# Patient Record
Sex: Female | Born: 1980 | Race: White | Hispanic: No | Marital: Married | Smoking: Never smoker
Health system: Southern US, Community
[De-identification: ages and names within clinical notes are randomized; demographics above are authoritative.]

## PROBLEM LIST (undated history)

## (undated) DIAGNOSIS — J45909 Unspecified asthma, uncomplicated: Secondary | ICD-10-CM

## (undated) DIAGNOSIS — E119 Type 2 diabetes mellitus without complications: Secondary | ICD-10-CM

## (undated) HISTORY — DX: Type 2 diabetes mellitus without complications: E11.9

## (undated) HISTORY — PX: APPENDECTOMY: SHX54

## (undated) HISTORY — DX: Unspecified asthma, uncomplicated: J45.909

## (undated) HISTORY — PX: CARPAL TUNNEL RELEASE: SHX101

## (undated) HISTORY — PX: AUGMENTATION MAMMAPLASTY: SUR837

---
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
## 2001-04-02 ENCOUNTER — Other Ambulatory Visit: Admission: RE | Admit: 2001-04-02 | Discharge: 2001-04-02 | Payer: Self-pay | Admitting: Family Medicine

## 2002-05-27 ENCOUNTER — Encounter: Admission: RE | Admit: 2002-05-27 | Discharge: 2002-08-25 | Payer: Self-pay | Admitting: Endocrinology

## 2005-12-09 ENCOUNTER — Ambulatory Visit (HOSPITAL_COMMUNITY): Admission: RE | Admit: 2005-12-09 | Discharge: 2005-12-09 | Payer: Self-pay | Admitting: Orthopedic Surgery

## 2007-11-11 ENCOUNTER — Encounter: Admission: RE | Admit: 2007-11-11 | Discharge: 2008-02-04 | Payer: Self-pay | Admitting: Orthopedic Surgery

## 2008-01-27 ENCOUNTER — Ambulatory Visit (HOSPITAL_COMMUNITY): Admission: RE | Admit: 2008-01-27 | Discharge: 2008-01-27 | Payer: Self-pay | Admitting: Orthopedic Surgery

## 2010-06-22 NOTE — Op Note (Signed)
[place]  Patient:    Gail Jones, Gail Jones                         MRN: [id number] Proc. Date: 05/22/99 Adm. Date:  [id number] Attending:  Armanda Magic                           Operative Report  INDICATIONS:  This is a 30 year old white female with a history of inappropriate sinus tachycardia and probable paroxysmal supraventricular tachycardia with borderline hypotension.  She has been having symptoms of palpitations and dizziness.  She now presents for tilt-table testing.  DESCRIPTION OF PROCEDURE:  The patient was brought to the electrophysiology laboratory in the fasted, nonsedated state.  Informed consent was obtained. The patient was connected to continuous heartrate and pulse oximetry monitoring and  intermittent blood pressure monitoring.  An IV was placed in her arm.  The patient was placed on the tilt-table and baseline blood pressure measured for a total of 9 minutes.  She was then tilted upright to 70 degrees for a total of 45 minutes. She had no symptoms during her upright tilt.  Her baseline blood pressure ranged from 108/72 to 117/62 with heartrates of 70 to 80.  During her upright tilt her lowest blood pressure was 91/70 with a heartrate anywhere from 78 to 119.  Again she had no symptoms during the tilt and at the end of the study, she was transferred back to her room in stable condition.  IMPRESSION:  Negative tilt-table test.  The patient appears to have appropriate  sinus tachycardia for mild decrease in blood pressure with upright tilt.  We could not reproduce her symptoms of palpitations, therefore, we will treat her for probable PSVT on her recent Holter monitor with Cardizem CD 120 mg a day.  She ill follow up in two weeks. DD:  05/22/99 TD:  05/22/99 Job: [id number]

## 2012-03-16 ENCOUNTER — Encounter: Payer: 59 | Attending: Endocrinology | Admitting: *Deleted

## 2012-03-16 ENCOUNTER — Encounter: Payer: Self-pay | Admitting: *Deleted

## 2012-03-16 DIAGNOSIS — Z713 Dietary counseling and surveillance: Secondary | ICD-10-CM | POA: Insufficient documentation

## 2012-03-16 DIAGNOSIS — E109 Type 1 diabetes mellitus without complications: Secondary | ICD-10-CM | POA: Insufficient documentation

## 2012-03-16 NOTE — Progress Notes (Signed)
  Medical Nutrition Therapy:  Appt start time: 1500 end time:  1600.  Assessment:  Primary concerns today: patient here for nutrition and preparation for pregnancy with DM1. On Tandem T-slim pump with Dexcom G4 CGM system. Lives with husband, they both shop and prepare meals. 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 She states she is attempting to follow the Paleo Diet as it is lower in Carbohydrate.  MEDICATIONS: see list. On Tandem T-slim insulin pump Stated current pump settings: Basal: MN: 0.725  4 AM: 0.925  8 AM: 1.05 for rest of day States she does use Temp Basal for anaerobic exercise by increasing rate by 80% due to climb in BG  Bolus: Carb ratio: 1/15 grams but she states she corrects that to 1/12 due to elevated post meal BGs. Correction: 1/40 Target: 100   DIETARY INTAKE:  Usual eating pattern includes 3 meals and 1-2 snacks per day.  Everyday foods include good variety of all food groups.  Avoided foods include high calorie foods.    24-hr recall:  B ( AM): eggs, occasionally bacon or grits, skim milk or water  Snk ( AM): sleeping  L (3 PM): grilled chicken and vegetables OR other meat and vegetables, diet soda Snk ( PM): not if going into work, on days off,  D (7 PM or 12 AM): meat and vegetables, gluten free bread occasionally, diet soda or water Snk ( PM): pkg of crackers or a banana Beverages: diet soda or water, skim milk  Usual physical activity: cross fit at gym 3 days a week  Estimated energy needs: 1600 calories 180 g carbohydrates 120 g protein 44 g fat  Progress Towards Goal(s):  In progress.   Nutritional Diagnosis:  NB-1.1 Food and nutrition-related knowledge deficit As related to diabetes maagement prior to pregnancy.  As evidenced by A1c of 6.9% improved from 8% earlier.    Intervention:  Nutrition counseling and diabetes education initiated. Discussed her use of pump  therapy, and encouraged her to continue using the bolus tool to allow consistency with BG management. Confirmed the importance SMBG and value of checking BG at alternate times of day in conjunction with her CGM and acknowledged the improvement in her A1c,. Reviewed her Carb Counting ability and discussed that if she has to eat to prevent low BG in the afternoons, perhaps her basal rate is too strong at that time of day.   Plan: Consider a stronger Carb Ratio if post meal BGs are going more than 40 mg/dl above pre-meal BG Consider using extended bolus for higher fat meals to help with post meal BGs. Continue with current activity plan! Continue with healthy food choices Use snacks for appetite control if needed Continue checking BG as directed by MD and use of CGM If you have to eat to prevent low BG, perhaps your Basal Rate is too strong at that time of day.  Handouts given during visit include:  Diabetes Log Sheet with demonstration of Extended Bolus option on the back   Monitoring/Evaluation:  Dietary intake, exercise, ongoing use of pump and CGM, and body weight prn.

## 2012-03-16 NOTE — Patient Instructions (Addendum)
Plan: Consider a stronger Carb Ratio if post meal BGs are going more than 40 mg/dl above pre-meal BG Consider using extended bolus for higher fat meals to help with post meal BGs. Continue with current activity plan! Continue with healthy food choices Use snacks for appetite control if needed Continue checking BG as directed by MD and use of CGM If you have to eat to prevent low BG, perhaps your Basal Rate is too strong at that time of day.

## 2012-11-05 ENCOUNTER — Ambulatory Visit (INDEPENDENT_AMBULATORY_CARE_PROVIDER_SITE_OTHER): Payer: Self-pay | Admitting: Family Medicine

## 2012-11-05 DIAGNOSIS — E119 Type 2 diabetes mellitus without complications: Secondary | ICD-10-CM

## 2012-11-05 NOTE — Progress Notes (Signed)
Subjective/Objective:  Patient presents at the [facility] for the employee sponsored Link to Wellness program for her three month follow-up appointment. She is a new patient for me today, as she has seen Armen Pickup, RN in the past. Patient is a Type 1 diabetic, diagnosed when she was 32 years old. Patient sees Dr. Adrian Prince for her endocrinology visits every three months. She is currently using the Tandem T-Slim insulin pump after taking a break for approximately one month in August 2014. Patient is very knowledgeable about her pump, carbohydrate counting, insulin dosing, correcting hyperglycemia and hypoglycemia, and testing her blood sugar. She also has a CGM that monitors her blood glucose. Patient is currently trying to get pregnant and will undergo another round of IVF within the next few months. She is highly motivated to keep her A1c under tight control while trying to conceive as well as during pregnancy.  A1c 6.8% (07/28/12); BP 132/87; P 77; Ht 5'1"; Wt 173 lbs; 7 day CBG average 145; 14 day CBG average 147; 30 day CBG average 174; Highest CBG 244; Lowest CBG 65  Assessment:  Patient is currently under fairly good control with her current medication regimen. She is currently using a Tandem T-Slim pump as well as Symlinpen for better after meal coverage. Patient uses Novolog insulin dosed 1 unit for 15gm carbohydrate. Her typical daily usage of Novolog is 36-45 units. She will correct with 1 unit of Novolog for blood glucose over 100. 1 unit typically brings her blood glucose down by about 40 points. Patient knows how to correctly count carbs and adjust insulin dose based on total carbohydrates per meal. Previous A1c of 6.8% was taken on 07/28/12 at Dr. Rinaldo Cloud office. Patient checks blood glucose 2 times a day, utilizing a CGM. Patient also uses a glucometer two times daily in order to calibrate the CGM. She reports one recent hypoglycemia episode when she felt "shaky." Patient's  blood glucose was 65 at that time. However, hypoglycemia frequency for the patient is extremely rare. In the last 10 years, patient reports that she has not had any low events where she could not manage herself. When starting back on her pump about a month ago, patient did experience some lows that she easily corrected with glucose tablets. Patient has hypoglycemia awareness, and the CGM alerts her when her blood glucose is dropping. She is scheduled to return to Dr. Evlyn Kanner on 11/02/12 for a repeat A1c. Patient was previously exercising 3-4 days/week. Admits that she has not been exercising regularly the past few months due to vacations. Patient checks feet weekly. Sees dentist every 6 months. Due for annual eye exam. Patient is not a candidate for ACE-inhibitor as she is currently trying to get pregnant.   Plan:  1. Increase exercise to achieve better A1c as well as decrease insulin requirement. 2. Make yearly eye exam. 3. Follow up with Paulino Door, PharmD at [facility] for 3 month follow-up on January 8th at 8:30am.

## 2013-02-11 ENCOUNTER — Ambulatory Visit (INDEPENDENT_AMBULATORY_CARE_PROVIDER_SITE_OTHER): Payer: Self-pay | Admitting: Family Medicine

## 2013-02-11 DIAGNOSIS — E119 Type 2 diabetes mellitus without complications: Secondary | ICD-10-CM

## 2013-02-11 NOTE — Progress Notes (Signed)
Subjective/Objective:  Patient presents today for Link to Wellness 3 month DM follow-up. Patient is a Type 1 diabetic, diagnosed when she was 33 years old. She is currently using the Tandem T-Slim insulin pump. Patient is very knowledgeable about her pump, carbohydrate counting, insulin dosing, correcting hyperglycemia and hypoglycemia, and testing her blood sugar. She also has a CGM that monitors her blood glucose. Patient reports no issues with her diabetes with the exception of one severe hypoglycemia episode that occurred approximately 3 weeks ago. Hr blood glucose dropped dangerously low, and EMS was called since her glucagon pen was expired. EMS was able to correct her blood glucose with dextrose, and patient was fine after that. Patient was not wearing her CGM since she her CGM was not working and she was waiting on a replacement. Patient also reports some recent depression that is currently being treated with medication.  A1c (02/08/13): 7.0% (up from 6.8% in June); BP 128/83; P 67; Ht 5'1"; Wt 172 pounds Patient has CGM but did not bring glucometer to today's appointment. Recalls BG normally between 120-155 with highest being 320.   Assessment:  Patient is currently using a Tandem T-Slim pump as well as Symlinpen for better after meal coverage. Patient uses Novolog insulin dosed 1 unit for 15gm carbohydrate. Her typical daily usage of Novolog is 36-45 units. She will correct with 1 unit of Novolog for blood glucose over 100. 1 unit typically brings her blood glucose down by about 40 points. Patient's last A1c was 7.0% on 02/08/13 (up from 6.8% in June). She is scheduled to return to Dr. Evlyn [facility] in February for her three month follow-up. Patient wears CMG but also checks blood glucose 2-3 times per day using a One Touch Verio IQ meter. Patient knows how to correctly count carbs and adjust insulin dose based on total carbohydrates per meal.   Patient reports one severe hypoglycemia episode that occurred  approximately 3 weeks ago. Patient had worked out previously that evening, and she forgot to eat anything before going to bed. Consequently, her blood glucose dropped dangerously low, and EMS was called since her glucagon pen was expired. EMS was able to correct her blood glucose with dextrose, and patient was fine after that. Patient was not wearing her CGM since she her CGM was not working and she was waiting on a replacement. Patient now has glucagon pen and will report any increase in hypoglycemic episodes.  Patient is currently exercising 3-4 days/week combining both cardio and weights. Sees dentist every 6 months. Had annual eye exam in late December with normal results. Checks feet every 1-2 days. Patient is not a candidate for ACE-inhibitor or daily aspirin as she is currently trying to get pregnant. Patient also reports some recent depression that is currently being treated with Cymbalta.   Plan:  1. Increase exercise to achieve lower A1c, less insulin requirement, and to help with depression.  2. Notify Dr. Evlyn [facility] and/or Morrie SheldonAshley if frequency of hypoglycemia increases.  3. Notify Dr. Evlyn [facility] if Cymbalta is not helping depression symptoms by 02/17/13.  4. Follow up with Paulino DoorAshley Iyah Laguna, PharmD at the Outpatient Pharmacy on Thursday, April 9th at 8:45am.

## 2013-05-20 ENCOUNTER — Ambulatory Visit (INDEPENDENT_AMBULATORY_CARE_PROVIDER_SITE_OTHER): Payer: Self-pay | Admitting: Family Medicine

## 2013-05-20 DIAGNOSIS — E119 Type 2 diabetes mellitus without complications: Secondary | ICD-10-CM

## 2013-05-20 NOTE — Progress Notes (Signed)
Subjective/Objective:  Patient presents today for her 3 month DM follow-up through the employer-sponsored Link to Wellness program. Patient is a Type 1 diabetic, diagnosed when she was 33 years old. She is currently using the Tandem T-Slim insulin pump. Patient is very knowledgeable about her pump, carbohydrate counting, insulin dosing, correcting hyperglycemia and hypoglycemia, and testing her blood sugar. She also has a CGM that monitors her blood glucose. Patient reports no issues with her diabetes as she is keeping a tighter control on her blood sugars for upcoming fertility treatments. She is scheduled to undergo In Vitro next week with Dr. Elesa Hackereaton as long as her A1c is under 7.0%. She reports doing well over the past three months since her last appointment with me. She has started exercising more and has actually tapered off of the Cymbalta with no recurrent depressive symptoms. She is currently using Novolog insulin for diabetes medication management.   Hemoglobin A1c: 6.8% in March (down from 7.0% in January); BP 117/64 mmHg; P 77 bpm; Ht 5'1"' Wt 172 lbs; BMI 32.5; Pain level 0/10; Patient reports fasting CBG between 80-150 (max 150)   Assessment:  Patient is currently using a Tandem T-Slim pump. She was using Symlinpen for better after-meal coverage but has recently stopped that medication as patient is currently undergoing fertility treatments with Dr. Elesa Hackereaton. Patient uses Novolog insulin dosed 1 unit for 15gm carbohydrate. Her typical daily usage of Novolog is 36-45 units. She will correct with 1 unit of Novolog for blood glucose over 100. 1 unit typically brings her blood glucose down by about 40 points. Patient's last A1c was 6.8% in March (down from 7.0% in January). She is scheduled to return to Dr. [facility]lyn [facility] later this month for her three month follow-up. She will also see Dr. Elesa Hackereaton next week for In Vitro treatment as long as her A1c is under 7.0%. Patient has been highly motivated to keep  blood sugars under control as the fertility treatments cannot be performed if her A1c is above 7.0%. Patient wears CMG but also checks blood glucose 2-3 times per day using a One Touch Verio IQ meter. Patient knows how to correctly count carbs and adjust insulin dose based on total carbs per meal. Patient reports a few hypoglycemia episodes since our last visit with a blood sugar of about 50. Patient will notify me and Dr. Evlyn [facility] if patient begins experiencing more hypoglycemic episodes.   Patient is currently exercising 3-4 days/week combining both cardio and weights. Sees dentist every 6 months. Had annual eye exam in late December with normal results. Checks feet every 1-2 days. Patient is not a candidate for ACE-inhibitor as she is currently trying to get pregnant. Patient has discontinued Cymbalta as she is trying to get pregnant. Reports no depressive symptoms at this point in time.   Plan:  Patient's personal goals before next follow up visit with me are as follows:  1. Continue maintaining regular exercise and healthy eating habits.  2. Monitor hypoglycemia episodes and report any increase in number of episodes.

## 2013-05-21 ENCOUNTER — Ambulatory Visit: Payer: 59 | Admitting: Family Medicine

## 2013-10-12 ENCOUNTER — Encounter: Payer: Self-pay | Admitting: Family Medicine

## 2013-10-12 DIAGNOSIS — E119 Type 2 diabetes mellitus without complications: Secondary | ICD-10-CM | POA: Insufficient documentation

## 2013-10-12 DIAGNOSIS — E109 Type 1 diabetes mellitus without complications: Secondary | ICD-10-CM | POA: Insufficient documentation

## 2013-10-12 NOTE — Progress Notes (Signed)
Patient ID: Gail Jones, female   DOB: 04/19/1980, 33 y.o.   MRN: [id number] Reviewed: Agree with the documentation and management of our [place] Pharmacologist.   

## 2013-10-12 NOTE — Progress Notes (Signed)
Patient ID: Gail Jones, female   DOB: January 24, 1981, 33 y.o.   MRN: [id number] Reviewed: Agree with the documentation and management of our [facility] Pharmacologist.

## 2013-10-12 NOTE — Progress Notes (Signed)
Patient ID: Gail Jones, female   DOB: 03/06/1980, 33 y.o.   MRN: [id number] Reviewed: Agree with the documentation and management of our [place] Pharmacologist.   

## 2015-09-11 DIAGNOSIS — F900 Attention-deficit hyperactivity disorder, predominantly inattentive type: Secondary | ICD-10-CM | POA: Diagnosis not present

## 2015-09-11 DIAGNOSIS — E109 Type 1 diabetes mellitus without complications: Secondary | ICD-10-CM | POA: Diagnosis not present

## 2015-09-11 DIAGNOSIS — R946 Abnormal results of thyroid function studies: Secondary | ICD-10-CM | POA: Diagnosis not present

## 2015-09-11 DIAGNOSIS — F329 Major depressive disorder, single episode, unspecified: Secondary | ICD-10-CM | POA: Diagnosis not present

## 2015-12-15 DIAGNOSIS — Z23 Encounter for immunization: Secondary | ICD-10-CM | POA: Diagnosis not present

## 2016-03-27 DIAGNOSIS — J22 Unspecified acute lower respiratory infection: Secondary | ICD-10-CM | POA: Diagnosis not present

## 2016-03-27 DIAGNOSIS — J209 Acute bronchitis, unspecified: Secondary | ICD-10-CM | POA: Diagnosis not present

## 2016-03-27 DIAGNOSIS — J111 Influenza due to unidentified influenza virus with other respiratory manifestations: Secondary | ICD-10-CM | POA: Diagnosis not present

## 2016-03-27 DIAGNOSIS — E108 Type 1 diabetes mellitus with unspecified complications: Secondary | ICD-10-CM | POA: Diagnosis not present

## 2016-04-05 DIAGNOSIS — R946 Abnormal results of thyroid function studies: Secondary | ICD-10-CM | POA: Diagnosis not present

## 2016-04-05 DIAGNOSIS — Z1389 Encounter for screening for other disorder: Secondary | ICD-10-CM | POA: Diagnosis not present

## 2016-04-05 DIAGNOSIS — E109 Type 1 diabetes mellitus without complications: Secondary | ICD-10-CM | POA: Diagnosis not present

## 2016-04-05 DIAGNOSIS — Z6834 Body mass index (BMI) 34.0-34.9, adult: Secondary | ICD-10-CM | POA: Diagnosis not present

## 2016-04-22 DIAGNOSIS — E109 Type 1 diabetes mellitus without complications: Secondary | ICD-10-CM | POA: Diagnosis not present

## 2016-05-08 DIAGNOSIS — J029 Acute pharyngitis, unspecified: Secondary | ICD-10-CM | POA: Diagnosis not present

## 2016-05-08 DIAGNOSIS — J020 Streptococcal pharyngitis: Secondary | ICD-10-CM | POA: Diagnosis not present

## 2016-05-16 DIAGNOSIS — J020 Streptococcal pharyngitis: Secondary | ICD-10-CM | POA: Diagnosis not present

## 2016-05-16 DIAGNOSIS — J309 Allergic rhinitis, unspecified: Secondary | ICD-10-CM | POA: Diagnosis not present

## 2016-07-02 DIAGNOSIS — M5416 Radiculopathy, lumbar region: Secondary | ICD-10-CM | POA: Diagnosis not present

## 2016-07-02 DIAGNOSIS — M9903 Segmental and somatic dysfunction of lumbar region: Secondary | ICD-10-CM | POA: Diagnosis not present

## 2016-07-02 DIAGNOSIS — M545 Low back pain: Secondary | ICD-10-CM | POA: Diagnosis not present

## 2016-07-02 DIAGNOSIS — S39012A Strain of muscle, fascia and tendon of lower back, initial encounter: Secondary | ICD-10-CM | POA: Diagnosis not present

## 2016-07-03 DIAGNOSIS — S39012A Strain of muscle, fascia and tendon of lower back, initial encounter: Secondary | ICD-10-CM | POA: Diagnosis not present

## 2016-07-03 DIAGNOSIS — M5416 Radiculopathy, lumbar region: Secondary | ICD-10-CM | POA: Diagnosis not present

## 2016-07-03 DIAGNOSIS — M9903 Segmental and somatic dysfunction of lumbar region: Secondary | ICD-10-CM | POA: Diagnosis not present

## 2016-07-03 DIAGNOSIS — M545 Low back pain: Secondary | ICD-10-CM | POA: Diagnosis not present

## 2016-07-04 DIAGNOSIS — M5416 Radiculopathy, lumbar region: Secondary | ICD-10-CM | POA: Diagnosis not present

## 2016-07-04 DIAGNOSIS — M545 Low back pain: Secondary | ICD-10-CM | POA: Diagnosis not present

## 2016-07-04 DIAGNOSIS — S39012A Strain of muscle, fascia and tendon of lower back, initial encounter: Secondary | ICD-10-CM | POA: Diagnosis not present

## 2016-07-04 DIAGNOSIS — M9903 Segmental and somatic dysfunction of lumbar region: Secondary | ICD-10-CM | POA: Diagnosis not present

## 2016-07-08 DIAGNOSIS — M9903 Segmental and somatic dysfunction of lumbar region: Secondary | ICD-10-CM | POA: Diagnosis not present

## 2016-07-08 DIAGNOSIS — S39012A Strain of muscle, fascia and tendon of lower back, initial encounter: Secondary | ICD-10-CM | POA: Diagnosis not present

## 2016-07-08 DIAGNOSIS — M545 Low back pain: Secondary | ICD-10-CM | POA: Diagnosis not present

## 2016-07-08 DIAGNOSIS — M5416 Radiculopathy, lumbar region: Secondary | ICD-10-CM | POA: Diagnosis not present

## 2016-07-11 DIAGNOSIS — M9903 Segmental and somatic dysfunction of lumbar region: Secondary | ICD-10-CM | POA: Diagnosis not present

## 2016-07-11 DIAGNOSIS — M545 Low back pain: Secondary | ICD-10-CM | POA: Diagnosis not present

## 2016-07-11 DIAGNOSIS — M5416 Radiculopathy, lumbar region: Secondary | ICD-10-CM | POA: Diagnosis not present

## 2016-07-11 DIAGNOSIS — S39012A Strain of muscle, fascia and tendon of lower back, initial encounter: Secondary | ICD-10-CM | POA: Diagnosis not present

## 2016-09-27 DIAGNOSIS — N898 Other specified noninflammatory disorders of vagina: Secondary | ICD-10-CM | POA: Diagnosis not present

## 2016-09-27 DIAGNOSIS — Z01419 Encounter for gynecological examination (general) (routine) without abnormal findings: Secondary | ICD-10-CM | POA: Diagnosis not present

## 2016-09-27 DIAGNOSIS — N760 Acute vaginitis: Secondary | ICD-10-CM | POA: Diagnosis not present

## 2016-09-27 DIAGNOSIS — B9689 Other specified bacterial agents as the cause of diseases classified elsewhere: Secondary | ICD-10-CM | POA: Diagnosis not present

## 2016-10-10 DIAGNOSIS — R946 Abnormal results of thyroid function studies: Secondary | ICD-10-CM | POA: Diagnosis not present

## 2016-10-10 DIAGNOSIS — E668 Other obesity: Secondary | ICD-10-CM | POA: Diagnosis not present

## 2016-10-10 DIAGNOSIS — Z1389 Encounter for screening for other disorder: Secondary | ICD-10-CM | POA: Diagnosis not present

## 2016-10-10 DIAGNOSIS — F3289 Other specified depressive episodes: Secondary | ICD-10-CM | POA: Diagnosis not present

## 2016-10-10 DIAGNOSIS — E109 Type 1 diabetes mellitus without complications: Secondary | ICD-10-CM | POA: Diagnosis not present

## 2016-11-06 DIAGNOSIS — E109 Type 1 diabetes mellitus without complications: Secondary | ICD-10-CM | POA: Diagnosis not present

## 2017-01-16 DIAGNOSIS — J0100 Acute maxillary sinusitis, unspecified: Secondary | ICD-10-CM | POA: Diagnosis not present

## 2017-01-16 DIAGNOSIS — J209 Acute bronchitis, unspecified: Secondary | ICD-10-CM | POA: Diagnosis not present

## 2017-02-03 DIAGNOSIS — E109 Type 1 diabetes mellitus without complications: Secondary | ICD-10-CM | POA: Diagnosis not present

## 2017-04-23 DIAGNOSIS — F900 Attention-deficit hyperactivity disorder, predominantly inattentive type: Secondary | ICD-10-CM | POA: Diagnosis not present

## 2017-04-23 DIAGNOSIS — E1065 Type 1 diabetes mellitus with hyperglycemia: Secondary | ICD-10-CM | POA: Diagnosis not present

## 2017-04-23 DIAGNOSIS — Z1389 Encounter for screening for other disorder: Secondary | ICD-10-CM | POA: Diagnosis not present

## 2017-04-23 DIAGNOSIS — R946 Abnormal results of thyroid function studies: Secondary | ICD-10-CM | POA: Diagnosis not present

## 2017-04-23 DIAGNOSIS — E668 Other obesity: Secondary | ICD-10-CM | POA: Diagnosis not present

## 2017-07-01 DIAGNOSIS — M25552 Pain in left hip: Secondary | ICD-10-CM | POA: Diagnosis not present

## 2017-07-08 DIAGNOSIS — M25552 Pain in left hip: Secondary | ICD-10-CM | POA: Diagnosis not present

## 2017-07-14 DIAGNOSIS — M25552 Pain in left hip: Secondary | ICD-10-CM | POA: Diagnosis not present

## 2017-07-28 DIAGNOSIS — M25552 Pain in left hip: Secondary | ICD-10-CM | POA: Diagnosis not present

## 2017-08-12 DIAGNOSIS — M25552 Pain in left hip: Secondary | ICD-10-CM | POA: Diagnosis not present

## 2017-08-25 DIAGNOSIS — Z794 Long term (current) use of insulin: Secondary | ICD-10-CM | POA: Diagnosis not present

## 2017-08-25 DIAGNOSIS — M25551 Pain in right hip: Secondary | ICD-10-CM | POA: Diagnosis not present

## 2017-08-25 DIAGNOSIS — E1065 Type 1 diabetes mellitus with hyperglycemia: Secondary | ICD-10-CM | POA: Diagnosis not present

## 2017-08-25 DIAGNOSIS — R946 Abnormal results of thyroid function studies: Secondary | ICD-10-CM | POA: Diagnosis not present

## 2017-09-10 DIAGNOSIS — S90562A Insect bite (nonvenomous), left ankle, initial encounter: Secondary | ICD-10-CM | POA: Diagnosis not present

## 2017-09-10 DIAGNOSIS — W57XXXA Bitten or stung by nonvenomous insect and other nonvenomous arthropods, initial encounter: Secondary | ICD-10-CM | POA: Diagnosis not present

## 2017-09-10 DIAGNOSIS — L089 Local infection of the skin and subcutaneous tissue, unspecified: Secondary | ICD-10-CM | POA: Diagnosis not present

## 2017-09-18 DIAGNOSIS — E109 Type 1 diabetes mellitus without complications: Secondary | ICD-10-CM | POA: Diagnosis not present

## 2017-09-24 DIAGNOSIS — M25552 Pain in left hip: Secondary | ICD-10-CM | POA: Diagnosis not present

## 2017-09-25 DIAGNOSIS — M7062 Trochanteric bursitis, left hip: Secondary | ICD-10-CM | POA: Diagnosis not present

## 2017-12-23 DIAGNOSIS — E109 Type 1 diabetes mellitus without complications: Secondary | ICD-10-CM | POA: Diagnosis not present

## 2017-12-23 DIAGNOSIS — Z794 Long term (current) use of insulin: Secondary | ICD-10-CM | POA: Diagnosis not present

## 2017-12-23 DIAGNOSIS — E1065 Type 1 diabetes mellitus with hyperglycemia: Secondary | ICD-10-CM | POA: Diagnosis not present

## 2017-12-23 DIAGNOSIS — M25551 Pain in right hip: Secondary | ICD-10-CM | POA: Diagnosis not present

## 2017-12-24 DIAGNOSIS — E109 Type 1 diabetes mellitus without complications: Secondary | ICD-10-CM | POA: Diagnosis not present

## 2018-02-20 DIAGNOSIS — Z794 Long term (current) use of insulin: Secondary | ICD-10-CM | POA: Diagnosis not present

## 2018-02-20 DIAGNOSIS — E109 Type 1 diabetes mellitus without complications: Secondary | ICD-10-CM | POA: Diagnosis not present

## 2018-03-10 DIAGNOSIS — N920 Excessive and frequent menstruation with regular cycle: Secondary | ICD-10-CM | POA: Diagnosis not present

## 2018-03-10 DIAGNOSIS — N854 Malposition of uterus: Secondary | ICD-10-CM | POA: Diagnosis not present

## 2018-03-10 DIAGNOSIS — N83292 Other ovarian cyst, left side: Secondary | ICD-10-CM | POA: Diagnosis not present

## 2018-04-01 ENCOUNTER — Other Ambulatory Visit: Payer: Self-pay | Admitting: Obstetrics and Gynecology

## 2018-04-01 DIAGNOSIS — Z1231 Encounter for screening mammogram for malignant neoplasm of breast: Secondary | ICD-10-CM

## 2018-04-10 DIAGNOSIS — J101 Influenza due to other identified influenza virus with other respiratory manifestations: Secondary | ICD-10-CM | POA: Diagnosis not present

## 2018-04-10 DIAGNOSIS — Z20828 Contact with and (suspected) exposure to other viral communicable diseases: Secondary | ICD-10-CM | POA: Diagnosis not present

## 2018-04-14 DIAGNOSIS — T3695XA Adverse effect of unspecified systemic antibiotic, initial encounter: Secondary | ICD-10-CM | POA: Diagnosis not present

## 2018-04-14 DIAGNOSIS — R05 Cough: Secondary | ICD-10-CM | POA: Diagnosis not present

## 2018-04-14 DIAGNOSIS — J22 Unspecified acute lower respiratory infection: Secondary | ICD-10-CM | POA: Diagnosis not present

## 2018-04-14 DIAGNOSIS — B379 Candidiasis, unspecified: Secondary | ICD-10-CM | POA: Diagnosis not present

## 2018-04-22 ENCOUNTER — Other Ambulatory Visit: Payer: Self-pay

## 2018-04-22 DIAGNOSIS — R6889 Other general symptoms and signs: Secondary | ICD-10-CM

## 2018-04-27 ENCOUNTER — Ambulatory Visit: Payer: 59

## 2018-04-28 LAB — NOVEL CORONAVIRUS, NAA: SARS-COV-2, NAA: NOT DETECTED

## 2018-04-30 DIAGNOSIS — E109 Type 1 diabetes mellitus without complications: Secondary | ICD-10-CM | POA: Diagnosis not present

## 2018-05-04 DIAGNOSIS — Z794 Long term (current) use of insulin: Secondary | ICD-10-CM | POA: Diagnosis not present

## 2018-05-04 DIAGNOSIS — E669 Obesity, unspecified: Secondary | ICD-10-CM | POA: Diagnosis not present

## 2018-05-04 DIAGNOSIS — R946 Abnormal results of thyroid function studies: Secondary | ICD-10-CM | POA: Diagnosis not present

## 2018-05-04 DIAGNOSIS — E109 Type 1 diabetes mellitus without complications: Secondary | ICD-10-CM | POA: Diagnosis not present

## 2018-06-16 DIAGNOSIS — E109 Type 1 diabetes mellitus without complications: Secondary | ICD-10-CM | POA: Diagnosis not present

## 2018-07-10 DIAGNOSIS — Z794 Long term (current) use of insulin: Secondary | ICD-10-CM | POA: Diagnosis not present

## 2018-07-10 DIAGNOSIS — E1065 Type 1 diabetes mellitus with hyperglycemia: Secondary | ICD-10-CM | POA: Diagnosis not present

## 2018-07-10 DIAGNOSIS — R609 Edema, unspecified: Secondary | ICD-10-CM | POA: Diagnosis not present

## 2018-08-31 DIAGNOSIS — E109 Type 1 diabetes mellitus without complications: Secondary | ICD-10-CM | POA: Diagnosis not present

## 2018-09-01 DIAGNOSIS — Z79899 Other long term (current) drug therapy: Secondary | ICD-10-CM | POA: Diagnosis not present

## 2018-09-01 DIAGNOSIS — R609 Edema, unspecified: Secondary | ICD-10-CM | POA: Diagnosis not present

## 2018-09-01 DIAGNOSIS — Z794 Long term (current) use of insulin: Secondary | ICD-10-CM | POA: Diagnosis not present

## 2018-09-01 DIAGNOSIS — E108 Type 1 diabetes mellitus with unspecified complications: Secondary | ICD-10-CM | POA: Diagnosis not present

## 2018-09-01 DIAGNOSIS — Z1331 Encounter for screening for depression: Secondary | ICD-10-CM | POA: Diagnosis not present

## 2018-09-01 DIAGNOSIS — E109 Type 1 diabetes mellitus without complications: Secondary | ICD-10-CM | POA: Diagnosis not present

## 2018-09-01 DIAGNOSIS — E669 Obesity, unspecified: Secondary | ICD-10-CM | POA: Diagnosis not present

## 2018-09-01 DIAGNOSIS — F909 Attention-deficit hyperactivity disorder, unspecified type: Secondary | ICD-10-CM | POA: Diagnosis not present

## 2018-09-07 DIAGNOSIS — E109 Type 1 diabetes mellitus without complications: Secondary | ICD-10-CM | POA: Diagnosis not present

## 2018-09-07 DIAGNOSIS — Z794 Long term (current) use of insulin: Secondary | ICD-10-CM | POA: Diagnosis not present

## 2018-09-24 DIAGNOSIS — E109 Type 1 diabetes mellitus without complications: Secondary | ICD-10-CM | POA: Diagnosis not present

## 2018-11-25 DIAGNOSIS — E109 Type 1 diabetes mellitus without complications: Secondary | ICD-10-CM | POA: Diagnosis not present

## 2018-11-26 DIAGNOSIS — F909 Attention-deficit hyperactivity disorder, unspecified type: Secondary | ICD-10-CM | POA: Diagnosis not present

## 2018-12-28 DIAGNOSIS — E109 Type 1 diabetes mellitus without complications: Secondary | ICD-10-CM | POA: Diagnosis not present

## 2019-01-12 DIAGNOSIS — M2550 Pain in unspecified joint: Secondary | ICD-10-CM | POA: Diagnosis not present

## 2019-01-12 DIAGNOSIS — M722 Plantar fascial fibromatosis: Secondary | ICD-10-CM | POA: Diagnosis not present

## 2019-01-13 DIAGNOSIS — M2550 Pain in unspecified joint: Secondary | ICD-10-CM | POA: Diagnosis not present

## 2019-01-18 DIAGNOSIS — E1065 Type 1 diabetes mellitus with hyperglycemia: Secondary | ICD-10-CM | POA: Diagnosis not present

## 2019-01-19 DIAGNOSIS — E669 Obesity, unspecified: Secondary | ICD-10-CM | POA: Diagnosis not present

## 2019-01-19 DIAGNOSIS — R946 Abnormal results of thyroid function studies: Secondary | ICD-10-CM | POA: Diagnosis not present

## 2019-01-19 DIAGNOSIS — Z794 Long term (current) use of insulin: Secondary | ICD-10-CM | POA: Diagnosis not present

## 2019-01-19 DIAGNOSIS — E109 Type 1 diabetes mellitus without complications: Secondary | ICD-10-CM | POA: Diagnosis not present

## 2019-02-03 DIAGNOSIS — Z794 Long term (current) use of insulin: Secondary | ICD-10-CM | POA: Diagnosis not present

## 2019-02-03 DIAGNOSIS — E109 Type 1 diabetes mellitus without complications: Secondary | ICD-10-CM | POA: Diagnosis not present

## 2019-03-03 DIAGNOSIS — F909 Attention-deficit hyperactivity disorder, unspecified type: Secondary | ICD-10-CM | POA: Diagnosis not present

## 2019-03-03 DIAGNOSIS — N6012 Diffuse cystic mastopathy of left breast: Secondary | ICD-10-CM | POA: Diagnosis not present

## 2019-03-03 DIAGNOSIS — E108 Type 1 diabetes mellitus with unspecified complications: Secondary | ICD-10-CM | POA: Diagnosis not present

## 2019-03-03 DIAGNOSIS — Z9882 Breast implant status: Secondary | ICD-10-CM | POA: Diagnosis not present

## 2019-03-03 DIAGNOSIS — Z0000 Encounter for general adult medical examination without abnormal findings: Secondary | ICD-10-CM | POA: Diagnosis not present

## 2019-03-09 DIAGNOSIS — M722 Plantar fascial fibromatosis: Secondary | ICD-10-CM | POA: Diagnosis not present

## 2019-03-10 DIAGNOSIS — Z0000 Encounter for general adult medical examination without abnormal findings: Secondary | ICD-10-CM | POA: Diagnosis not present

## 2019-03-13 DIAGNOSIS — R030 Elevated blood-pressure reading, without diagnosis of hypertension: Secondary | ICD-10-CM | POA: Diagnosis not present

## 2019-03-13 DIAGNOSIS — R519 Headache, unspecified: Secondary | ICD-10-CM | POA: Diagnosis not present

## 2019-03-15 DIAGNOSIS — R030 Elevated blood-pressure reading, without diagnosis of hypertension: Secondary | ICD-10-CM | POA: Diagnosis not present

## 2019-03-15 DIAGNOSIS — R002 Palpitations: Secondary | ICD-10-CM | POA: Diagnosis not present

## 2019-03-15 DIAGNOSIS — R9431 Abnormal electrocardiogram [ECG] [EKG]: Secondary | ICD-10-CM | POA: Diagnosis not present

## 2019-03-15 DIAGNOSIS — Z8249 Family history of ischemic heart disease and other diseases of the circulatory system: Secondary | ICD-10-CM | POA: Diagnosis not present

## 2019-03-18 DIAGNOSIS — Z9882 Breast implant status: Secondary | ICD-10-CM | POA: Diagnosis not present

## 2019-03-18 DIAGNOSIS — N6012 Diffuse cystic mastopathy of left breast: Secondary | ICD-10-CM | POA: Diagnosis not present

## 2019-03-18 DIAGNOSIS — N6320 Unspecified lump in the left breast, unspecified quadrant: Secondary | ICD-10-CM | POA: Diagnosis not present

## 2019-03-18 DIAGNOSIS — N6489 Other specified disorders of breast: Secondary | ICD-10-CM | POA: Diagnosis not present

## 2019-03-18 DIAGNOSIS — R928 Other abnormal and inconclusive findings on diagnostic imaging of breast: Secondary | ICD-10-CM | POA: Diagnosis not present

## 2019-04-13 DIAGNOSIS — I517 Cardiomegaly: Secondary | ICD-10-CM | POA: Diagnosis not present

## 2019-04-15 DIAGNOSIS — R030 Elevated blood-pressure reading, without diagnosis of hypertension: Secondary | ICD-10-CM | POA: Diagnosis not present

## 2019-04-15 DIAGNOSIS — Z8249 Family history of ischemic heart disease and other diseases of the circulatory system: Secondary | ICD-10-CM | POA: Diagnosis not present

## 2019-04-15 DIAGNOSIS — R002 Palpitations: Secondary | ICD-10-CM | POA: Diagnosis not present

## 2019-04-26 DIAGNOSIS — E109 Type 1 diabetes mellitus without complications: Secondary | ICD-10-CM | POA: Diagnosis not present

## 2019-05-17 DIAGNOSIS — Z803 Family history of malignant neoplasm of breast: Secondary | ICD-10-CM | POA: Diagnosis not present

## 2019-05-17 DIAGNOSIS — N6320 Unspecified lump in the left breast, unspecified quadrant: Secondary | ICD-10-CM | POA: Diagnosis not present

## 2019-05-20 ENCOUNTER — Other Ambulatory Visit: Payer: Self-pay | Admitting: General Surgery

## 2019-05-20 DIAGNOSIS — N6320 Unspecified lump in the left breast, unspecified quadrant: Secondary | ICD-10-CM

## 2019-05-27 DIAGNOSIS — Z1389 Encounter for screening for other disorder: Secondary | ICD-10-CM | POA: Diagnosis not present

## 2019-05-27 DIAGNOSIS — E109 Type 1 diabetes mellitus without complications: Secondary | ICD-10-CM | POA: Diagnosis not present

## 2019-05-27 DIAGNOSIS — I10 Essential (primary) hypertension: Secondary | ICD-10-CM | POA: Diagnosis not present

## 2019-05-27 DIAGNOSIS — F900 Attention-deficit hyperactivity disorder, predominantly inattentive type: Secondary | ICD-10-CM | POA: Diagnosis not present

## 2019-05-27 DIAGNOSIS — N6019 Diffuse cystic mastopathy of unspecified breast: Secondary | ICD-10-CM | POA: Diagnosis not present

## 2019-06-01 ENCOUNTER — Ambulatory Visit
Admission: RE | Admit: 2019-06-01 | Discharge: 2019-06-01 | Disposition: A | Discharge status: Home / Self Care | Payer: BC Managed Care – PPO | Source: Ambulatory Visit | Attending: General Surgery | Admitting: General Surgery

## 2019-06-01 ENCOUNTER — Other Ambulatory Visit: Payer: Self-pay

## 2019-06-01 DIAGNOSIS — N6320 Unspecified lump in the left breast, unspecified quadrant: Secondary | ICD-10-CM

## 2019-06-01 MED ORDER — GADOBUTROL 1 MMOL/ML IV SOLN
9.0000 mL | Freq: Once | INTRAVENOUS | Status: AC | PRN
  Administered 2019-06-01: 08:00:00 9 mL via INTRAVENOUS

## 2019-07-28 DIAGNOSIS — J209 Acute bronchitis, unspecified: Secondary | ICD-10-CM | POA: Diagnosis not present

## 2019-07-28 DIAGNOSIS — Z20822 Contact with and (suspected) exposure to covid-19: Secondary | ICD-10-CM | POA: Diagnosis not present

## 2019-07-28 DIAGNOSIS — R0981 Nasal congestion: Secondary | ICD-10-CM | POA: Diagnosis not present

## 2019-07-28 DIAGNOSIS — R05 Cough: Secondary | ICD-10-CM | POA: Diagnosis not present

## 2019-07-29 DIAGNOSIS — E109 Type 1 diabetes mellitus without complications: Secondary | ICD-10-CM | POA: Diagnosis not present

## 2019-07-29 DIAGNOSIS — Z794 Long term (current) use of insulin: Secondary | ICD-10-CM | POA: Diagnosis not present

## 2019-08-03 DIAGNOSIS — J019 Acute sinusitis, unspecified: Secondary | ICD-10-CM | POA: Diagnosis not present

## 2019-08-03 DIAGNOSIS — J209 Acute bronchitis, unspecified: Secondary | ICD-10-CM | POA: Diagnosis not present

## 2019-08-03 DIAGNOSIS — R05 Cough: Secondary | ICD-10-CM | POA: Diagnosis not present

## 2019-08-04 DIAGNOSIS — M722 Plantar fascial fibromatosis: Secondary | ICD-10-CM | POA: Diagnosis not present

## 2019-09-01 DIAGNOSIS — M722 Plantar fascial fibromatosis: Secondary | ICD-10-CM | POA: Diagnosis not present

## 2019-09-09 DIAGNOSIS — E1065 Type 1 diabetes mellitus with hyperglycemia: Secondary | ICD-10-CM | POA: Diagnosis not present

## 2019-09-09 DIAGNOSIS — Z794 Long term (current) use of insulin: Secondary | ICD-10-CM | POA: Diagnosis not present

## 2019-09-09 DIAGNOSIS — R946 Abnormal results of thyroid function studies: Secondary | ICD-10-CM | POA: Diagnosis not present

## 2019-09-09 DIAGNOSIS — E669 Obesity, unspecified: Secondary | ICD-10-CM | POA: Diagnosis not present

## 2019-09-09 DIAGNOSIS — I10 Essential (primary) hypertension: Secondary | ICD-10-CM | POA: Diagnosis not present

## 2019-09-17 DIAGNOSIS — M25572 Pain in left ankle and joints of left foot: Secondary | ICD-10-CM | POA: Diagnosis not present

## 2019-09-17 DIAGNOSIS — M79672 Pain in left foot: Secondary | ICD-10-CM | POA: Diagnosis not present

## 2019-09-22 DIAGNOSIS — M722 Plantar fascial fibromatosis: Secondary | ICD-10-CM | POA: Diagnosis not present

## 2019-09-24 DIAGNOSIS — M79672 Pain in left foot: Secondary | ICD-10-CM | POA: Diagnosis not present

## 2019-11-10 DIAGNOSIS — R5381 Other malaise: Secondary | ICD-10-CM | POA: Diagnosis not present

## 2019-11-10 DIAGNOSIS — Z20828 Contact with and (suspected) exposure to other viral communicable diseases: Secondary | ICD-10-CM | POA: Diagnosis not present

## 2019-11-12 ENCOUNTER — Other Ambulatory Visit: Payer: Self-pay | Admitting: General Surgery

## 2019-11-12 DIAGNOSIS — N6310 Unspecified lump in the right breast, unspecified quadrant: Secondary | ICD-10-CM

## 2019-11-15 DIAGNOSIS — Z20822 Contact with and (suspected) exposure to covid-19: Secondary | ICD-10-CM | POA: Diagnosis not present

## 2019-11-17 ENCOUNTER — Other Ambulatory Visit: Payer: Self-pay | Admitting: Oncology

## 2019-11-17 ENCOUNTER — Encounter: Payer: Self-pay | Admitting: Oncology

## 2019-11-17 DIAGNOSIS — E109 Type 1 diabetes mellitus without complications: Secondary | ICD-10-CM | POA: Diagnosis not present

## 2019-11-17 DIAGNOSIS — U071 COVID-19: Secondary | ICD-10-CM

## 2019-11-17 NOTE — Progress Notes (Signed)
I connected by phone with  Mrs. Manfredi  to discuss the potential use of an new treatment for mild to moderate COVID-19 viral infection in non-hospitalized patients.   This patient is a age/sex that meets the FDA criteria for Emergency Use Authorization of casirivimab\imdevimab.  Has a (+) direct SARS-CoV-2 viral test result 1. Has mild or moderate COVID-19  2. Is ? 38 years of age and weighs ? 40 kg 3. Is NOT hospitalized due to COVID-19 4. Is NOT requiring oxygen therapy or requiring an increase in baseline oxygen flow rate due to COVID-19 5. Is within 10 days of symptom onset 6. Has at least one of the high risk factor(s) for progression to severe COVID-19 and/or hospitalization as defined in EUA. Specific high risk criteria : Past Medical History:  Diagnosis Date  . Asthma   . Diabetes mellitus without complication   ?  ?    Symptom onset  11/12/19   I have spoken and communicated the following to the patient or parent/caregiver:   1. FDA has authorized the emergency use of casirivimab\imdevimab for the treatment of mild to moderate COVID-19 in adults and pediatric patients with positive results of direct SARS-CoV-2 viral testing who are 38 years of age and older weighing at least 40 kg, and who are at high risk for progressing to severe COVID-19 and/or hospitalization.   2. The significant known and potential risks and benefits of casirivimab\imdevimab, and the extent to which such potential risks and benefits are unknown.   3. Information on available alternative treatments and the risks and benefits of those alternatives, including clinical trials.   4. Patients treated with casirivimab\imdevimab should continue to self-isolate and use infection control measures (e.g., wear mask, isolate, social distance, avoid sharing personal items, clean and disinfect "high touch" surfaces, and frequent handwashing) according to CDC guidelines.    5. The patient or parent/caregiver has the  option to accept or refuse casirivimab\imdevimab .   After reviewing this information with the patient, The patient agreed to proceed with receiving casirivimab\imdevimab infusion and will be provided a copy of the Fact sheet prior to receiving the infusion.Mignon Pine, AGNP-C [phone] (Infusion Center Hotline)

## 2019-11-18 ENCOUNTER — Other Ambulatory Visit (HOSPITAL_COMMUNITY): Payer: Self-pay

## 2019-11-18 ENCOUNTER — Ambulatory Visit (HOSPITAL_COMMUNITY)
Admission: RE | Admit: 2019-11-18 | Discharge: 2019-11-18 | Disposition: A | Discharge status: Home / Self Care | Payer: BC Managed Care – PPO | Source: Ambulatory Visit | Attending: Pulmonary Disease | Admitting: Pulmonary Disease

## 2019-11-18 DIAGNOSIS — U071 COVID-19: Secondary | ICD-10-CM | POA: Diagnosis not present

## 2019-11-18 MED ORDER — SODIUM CHLORIDE 0.9 % IV SOLN: Freq: Once | INTRAVENOUS | Status: AC

## 2019-11-18 MED ORDER — EPINEPHRINE 0.3 MG/0.3ML IJ SOAJ: 0.3000 mg | Freq: Once | INTRAMUSCULAR | Status: DC | PRN

## 2019-11-18 MED ORDER — METHYLPREDNISOLONE SODIUM SUCC 125 MG IJ SOLR: 125.0000 mg | Freq: Once | INTRAMUSCULAR | Status: DC | PRN

## 2019-11-18 MED ORDER — FAMOTIDINE IN NACL 20-0.9 MG/50ML-% IV SOLN: 20.0000 mg | Freq: Once | INTRAVENOUS | Status: DC | PRN

## 2019-11-18 MED ORDER — SODIUM CHLORIDE 0.9 % IV SOLN: INTRAVENOUS | Status: DC | PRN

## 2019-11-18 MED ORDER — ALBUTEROL SULFATE HFA 108 (90 BASE) MCG/ACT IN AERS: 2.0000 | INHALATION_SPRAY | Freq: Once | RESPIRATORY_TRACT | Status: DC | PRN

## 2019-11-18 MED ORDER — DIPHENHYDRAMINE HCL 50 MG/ML IJ SOLN: 50.0000 mg | Freq: Once | INTRAMUSCULAR | Status: DC | PRN

## 2019-11-18 NOTE — Discharge Instructions (Signed)

## 2019-11-18 NOTE — Progress Notes (Signed)
  Diagnosis: COVID-19  Physician:Dr Wright  Procedure: [facility] Med: bamlanivimab\etesevimab infusion - Provided patient with bamlanimivab\etesevimab fact sheet for patients, parents and caregivers prior to infusion.  Complications: No immediate complications noted.  Discharge: Discharged home   Gail Jones 11/18/2019  

## 2019-11-24 DIAGNOSIS — Z20822 Contact with and (suspected) exposure to covid-19: Secondary | ICD-10-CM | POA: Diagnosis not present

## 2019-11-24 DIAGNOSIS — Z03818 Encounter for observation for suspected exposure to other biological agents ruled out: Secondary | ICD-10-CM | POA: Diagnosis not present

## 2019-12-17 ENCOUNTER — Other Ambulatory Visit: Payer: BC Managed Care – PPO

## 2019-12-17 DIAGNOSIS — M722 Plantar fascial fibromatosis: Secondary | ICD-10-CM | POA: Diagnosis not present

## 2019-12-17 DIAGNOSIS — M2550 Pain in unspecified joint: Secondary | ICD-10-CM | POA: Diagnosis not present

## 2020-01-07 DIAGNOSIS — R222 Localized swelling, mass and lump, trunk: Secondary | ICD-10-CM | POA: Diagnosis not present

## 2020-01-07 DIAGNOSIS — R3915 Urgency of urination: Secondary | ICD-10-CM | POA: Diagnosis not present

## 2020-01-07 DIAGNOSIS — Z79899 Other long term (current) drug therapy: Secondary | ICD-10-CM | POA: Diagnosis not present

## 2020-01-07 DIAGNOSIS — F909 Attention-deficit hyperactivity disorder, unspecified type: Secondary | ICD-10-CM | POA: Diagnosis not present

## 2020-01-13 DIAGNOSIS — R222 Localized swelling, mass and lump, trunk: Secondary | ICD-10-CM | POA: Diagnosis not present

## 2020-01-13 DIAGNOSIS — R1902 Left upper quadrant abdominal swelling, mass and lump: Secondary | ICD-10-CM | POA: Diagnosis not present

## 2020-01-13 DIAGNOSIS — N6320 Unspecified lump in the left breast, unspecified quadrant: Secondary | ICD-10-CM | POA: Diagnosis not present

## 2020-01-24 ENCOUNTER — Ambulatory Visit
Admission: RE | Admit: 2020-01-24 | Discharge: 2020-01-24 | Disposition: A | Discharge status: Home / Self Care | Payer: BC Managed Care – PPO | Source: Ambulatory Visit | Attending: General Surgery | Admitting: General Surgery

## 2020-01-24 ENCOUNTER — Other Ambulatory Visit: Payer: Self-pay

## 2020-01-24 DIAGNOSIS — N6310 Unspecified lump in the right breast, unspecified quadrant: Secondary | ICD-10-CM

## 2020-01-24 MED ORDER — GADOBUTROL 1 MMOL/ML IV SOLN
10.0000 mL | Freq: Once | INTRAVENOUS | Status: AC | PRN
  Administered 2020-01-24: 10 mL via INTRAVENOUS

## 2020-02-15 ENCOUNTER — Other Ambulatory Visit: Payer: BC Managed Care – PPO

## 2020-02-17 DIAGNOSIS — Z803 Family history of malignant neoplasm of breast: Secondary | ICD-10-CM | POA: Diagnosis not present

## 2020-02-17 DIAGNOSIS — N843 Polyp of vulva: Secondary | ICD-10-CM | POA: Diagnosis not present

## 2020-02-17 DIAGNOSIS — Z79899 Other long term (current) drug therapy: Secondary | ICD-10-CM | POA: Diagnosis not present

## 2020-02-17 DIAGNOSIS — Z1151 Encounter for screening for human papillomavirus (HPV): Secondary | ICD-10-CM | POA: Diagnosis not present

## 2020-02-17 DIAGNOSIS — Z01419 Encounter for gynecological examination (general) (routine) without abnormal findings: Secondary | ICD-10-CM | POA: Diagnosis not present

## 2020-02-17 DIAGNOSIS — R350 Frequency of micturition: Secondary | ICD-10-CM | POA: Diagnosis not present

## 2020-02-17 DIAGNOSIS — R102 Pelvic and perineal pain: Secondary | ICD-10-CM | POA: Diagnosis not present

## 2020-02-17 DIAGNOSIS — Z8041 Family history of malignant neoplasm of ovary: Secondary | ICD-10-CM | POA: Diagnosis not present

## 2020-02-17 DIAGNOSIS — Z01411 Encounter for gynecological examination (general) (routine) with abnormal findings: Secondary | ICD-10-CM | POA: Diagnosis not present

## 2020-02-17 DIAGNOSIS — Z8249 Family history of ischemic heart disease and other diseases of the circulatory system: Secondary | ICD-10-CM | POA: Diagnosis not present

## 2020-02-17 DIAGNOSIS — N9089 Other specified noninflammatory disorders of vulva and perineum: Secondary | ICD-10-CM | POA: Diagnosis not present

## 2020-02-17 DIAGNOSIS — Z03818 Encounter for observation for suspected exposure to other biological agents ruled out: Secondary | ICD-10-CM | POA: Diagnosis not present

## 2020-02-17 DIAGNOSIS — N946 Dysmenorrhea, unspecified: Secondary | ICD-10-CM | POA: Diagnosis not present

## 2020-02-17 DIAGNOSIS — Z794 Long term (current) use of insulin: Secondary | ICD-10-CM | POA: Diagnosis not present

## 2020-02-29 DIAGNOSIS — N946 Dysmenorrhea, unspecified: Secondary | ICD-10-CM | POA: Diagnosis not present

## 2020-02-29 DIAGNOSIS — N83292 Other ovarian cyst, left side: Secondary | ICD-10-CM | POA: Diagnosis not present

## 2020-03-06 DIAGNOSIS — E108 Type 1 diabetes mellitus with unspecified complications: Secondary | ICD-10-CM | POA: Diagnosis not present

## 2020-03-06 DIAGNOSIS — H6501 Acute serous otitis media, right ear: Secondary | ICD-10-CM | POA: Diagnosis not present

## 2020-03-15 DIAGNOSIS — E109 Type 1 diabetes mellitus without complications: Secondary | ICD-10-CM | POA: Diagnosis not present

## 2020-04-07 DIAGNOSIS — R030 Elevated blood-pressure reading, without diagnosis of hypertension: Secondary | ICD-10-CM | POA: Diagnosis not present

## 2020-05-09 DIAGNOSIS — E1065 Type 1 diabetes mellitus with hyperglycemia: Secondary | ICD-10-CM | POA: Diagnosis not present

## 2020-05-09 DIAGNOSIS — I10 Essential (primary) hypertension: Secondary | ICD-10-CM | POA: Diagnosis not present

## 2020-06-19 DIAGNOSIS — F909 Attention-deficit hyperactivity disorder, unspecified type: Secondary | ICD-10-CM | POA: Diagnosis not present

## 2020-06-28 DIAGNOSIS — Z794 Long term (current) use of insulin: Secondary | ICD-10-CM | POA: Diagnosis not present

## 2020-06-28 DIAGNOSIS — E109 Type 1 diabetes mellitus without complications: Secondary | ICD-10-CM | POA: Diagnosis not present

## 2020-09-27 DIAGNOSIS — E109 Type 1 diabetes mellitus without complications: Secondary | ICD-10-CM | POA: Diagnosis not present

## 2020-11-17 DIAGNOSIS — R946 Abnormal results of thyroid function studies: Secondary | ICD-10-CM | POA: Diagnosis not present

## 2020-11-17 DIAGNOSIS — E1065 Type 1 diabetes mellitus with hyperglycemia: Secondary | ICD-10-CM | POA: Diagnosis not present

## 2020-11-17 DIAGNOSIS — I10 Essential (primary) hypertension: Secondary | ICD-10-CM | POA: Diagnosis not present

## 2020-12-17 DIAGNOSIS — Z03818 Encounter for observation for suspected exposure to other biological agents ruled out: Secondary | ICD-10-CM | POA: Diagnosis not present

## 2020-12-21 ENCOUNTER — Other Ambulatory Visit: Payer: Self-pay | Admitting: General Surgery

## 2020-12-21 DIAGNOSIS — Z1231 Encounter for screening mammogram for malignant neoplasm of breast: Secondary | ICD-10-CM

## 2020-12-21 DIAGNOSIS — N6310 Unspecified lump in the right breast, unspecified quadrant: Secondary | ICD-10-CM

## 2020-12-27 ENCOUNTER — Other Ambulatory Visit: Payer: Self-pay | Admitting: General Surgery

## 2020-12-27 ENCOUNTER — Other Ambulatory Visit: Payer: Self-pay

## 2020-12-27 ENCOUNTER — Ambulatory Visit
Admission: RE | Admit: 2020-12-27 | Discharge: 2020-12-27 | Disposition: A | Discharge status: Home / Self Care | Payer: BC Managed Care – PPO | Source: Ambulatory Visit | Attending: General Surgery | Admitting: General Surgery

## 2020-12-27 DIAGNOSIS — E109 Type 1 diabetes mellitus without complications: Secondary | ICD-10-CM | POA: Diagnosis not present

## 2020-12-27 DIAGNOSIS — Z794 Long term (current) use of insulin: Secondary | ICD-10-CM | POA: Diagnosis not present

## 2020-12-27 DIAGNOSIS — Z1231 Encounter for screening mammogram for malignant neoplasm of breast: Secondary | ICD-10-CM

## 2021-01-24 ENCOUNTER — Ambulatory Visit: Payer: BC Managed Care – PPO

## 2021-02-02 ENCOUNTER — Other Ambulatory Visit: Payer: BC Managed Care – PPO

## 2021-02-22 ENCOUNTER — Other Ambulatory Visit: Payer: Self-pay

## 2021-02-22 ENCOUNTER — Ambulatory Visit
Admission: RE | Admit: 2021-02-22 | Discharge: 2021-02-22 | Disposition: A | Discharge status: Home / Self Care | Payer: BC Managed Care – PPO | Source: Ambulatory Visit | Attending: General Surgery | Admitting: General Surgery

## 2021-02-22 DIAGNOSIS — N6313 Unspecified lump in the right breast, lower outer quadrant: Secondary | ICD-10-CM | POA: Diagnosis not present

## 2021-02-22 DIAGNOSIS — Z803 Family history of malignant neoplasm of breast: Secondary | ICD-10-CM | POA: Diagnosis not present

## 2021-02-22 DIAGNOSIS — N6310 Unspecified lump in the right breast, unspecified quadrant: Secondary | ICD-10-CM

## 2021-02-22 DIAGNOSIS — N6311 Unspecified lump in the right breast, upper outer quadrant: Secondary | ICD-10-CM | POA: Diagnosis not present

## 2021-02-22 MED ORDER — GADOBUTROL 1 MMOL/ML IV SOLN
8.0000 mL | Freq: Once | INTRAVENOUS | Status: AC | PRN
  Administered 2021-02-22: 8 mL via INTRAVENOUS

## 2021-03-02 IMAGING — MR MR BREAST BILAT WO/W CM
8 of 12 series · 30 of 48 positions shown · IV contrast (10 ml Gadavist)
Comparison: MRI on 06/01/2019

CLINICAL DATA: Short interval follow-up for probably benign mass in
the RIGHT breast.

LABS:  None obtained at the time of imaging.
EXAM:
BILATERAL BREAST MRI WITH AND WITHOUT CONTRAST
TECHNIQUE: Multiplanar, multisequence MR images of both breasts were obtained
prior to and following the intravenous administration of 10 ml of
Gadavist

[Series 3: t2_tirm_tra ipat (a-p) · axial · 3.0mm · 0.78mm/px · 1 of 75 slices shown]
[im 1/75]
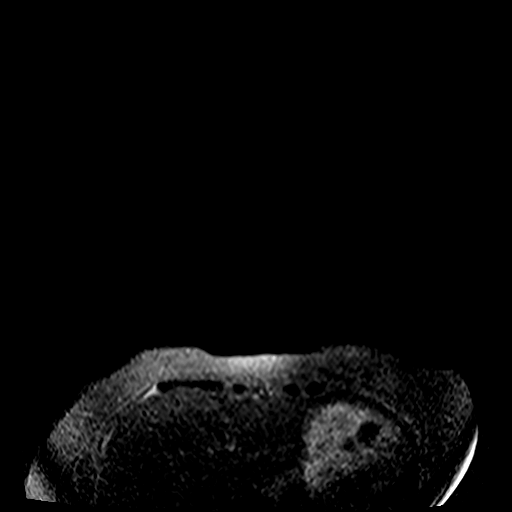

[Series 4: fl3d pre-cm no · axial · non-contrast · 0.9mm · 1.14mm/px · z∈[-108,+122]mm · 5 of 256 slices shown]
[im 1/256]
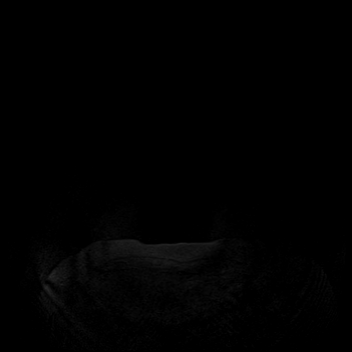
[im 64/256]
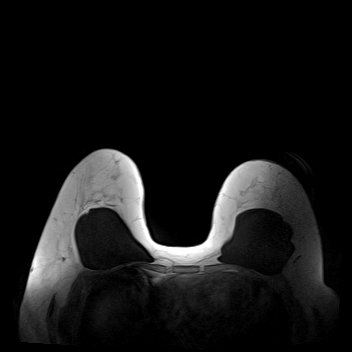
[im 128/256]
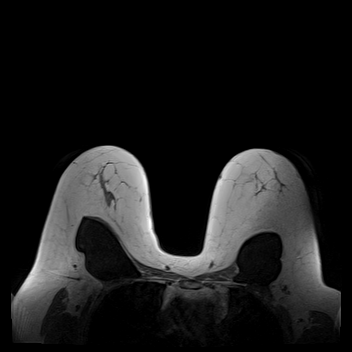
[im 192/256]
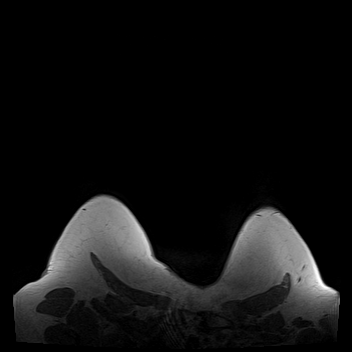
[im 256/256]
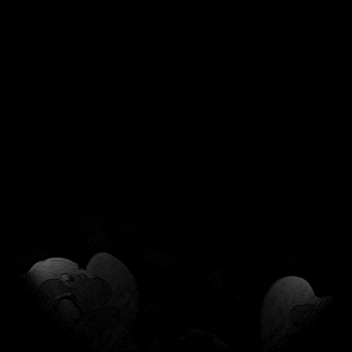

[Series 5: fl3d pre-cm · axial · non-contrast · 0.9mm · 0.96mm/px · z∈[-108,+122]mm · 5 of 256 slices shown]
[im 1/256]
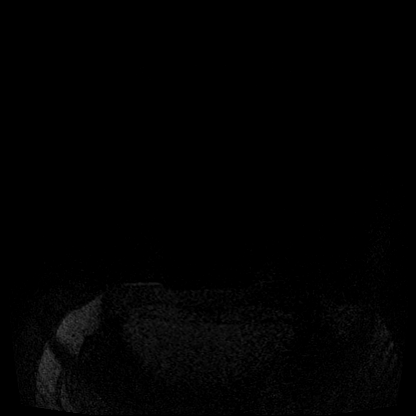
[im 64/256]
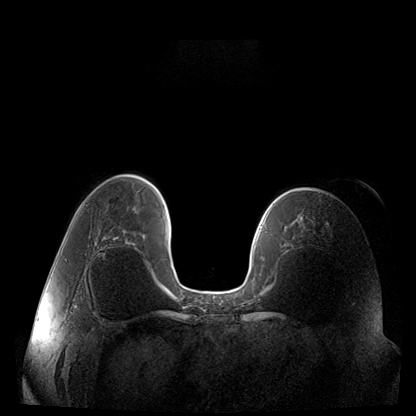
[im 128/256]
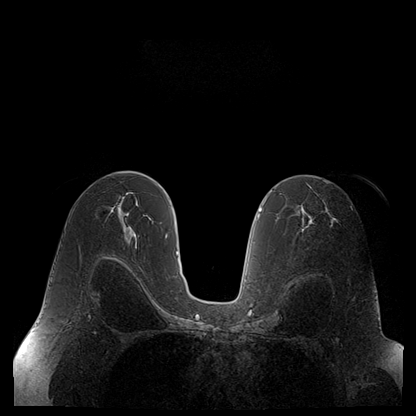
[im 192/256]
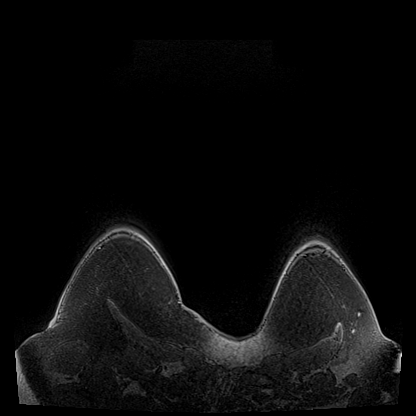
[im 256/256]
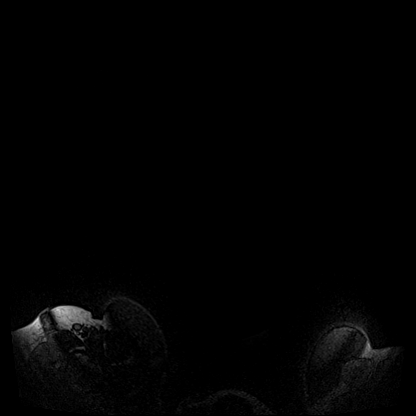

[Series 6: fl3d post-cm 20 · axial · 0.9mm · 0.96mm/px · z∈[-108,+122]mm · 5 of 256 slices shown (1 of 3)]
[im 1/256]
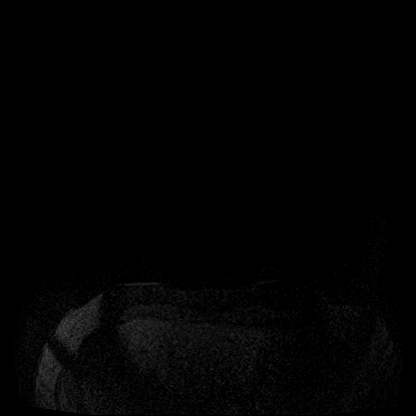
[im 64/256]
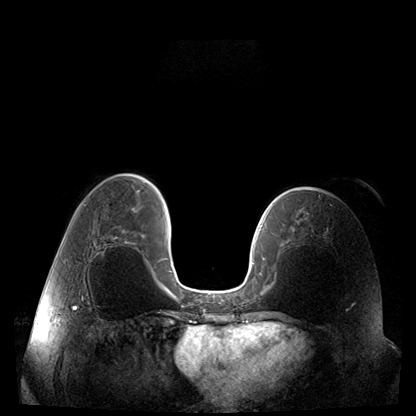
[im 128/256]
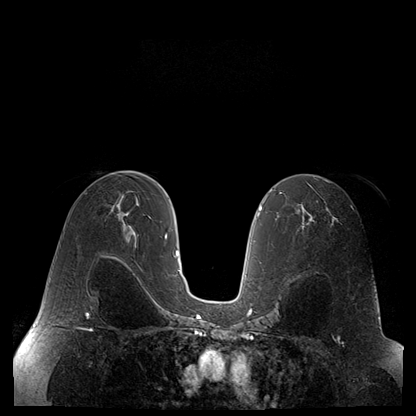
[im 192/256]
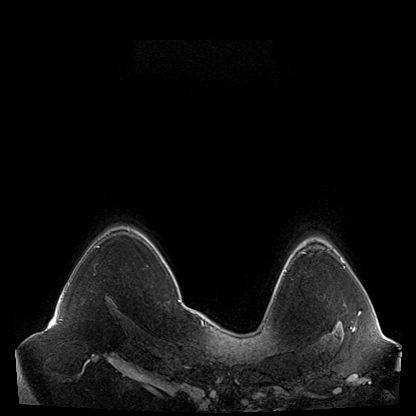
[im 256/256]
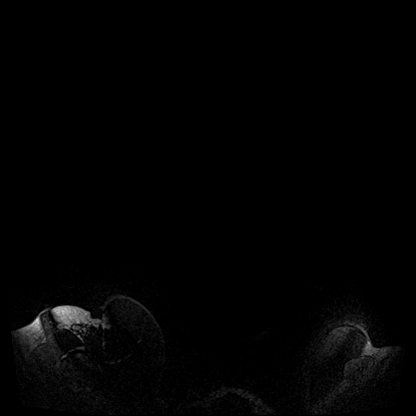

[Series 7: fl3d post-cm 20 · axial · 0.9mm · 0.96mm/px · z∈[-108,+122]mm · 5 of 256 slices shown (2 of 3)]
[im 1/256]
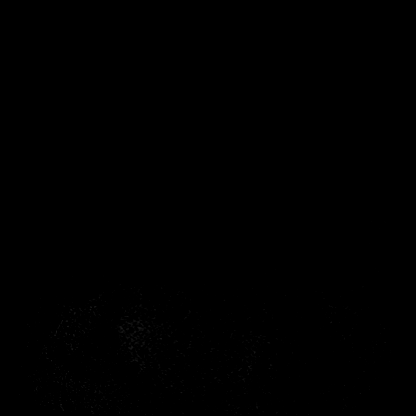
[im 64/256]
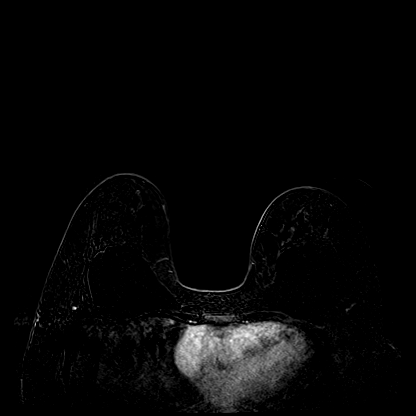
[im 128/256]
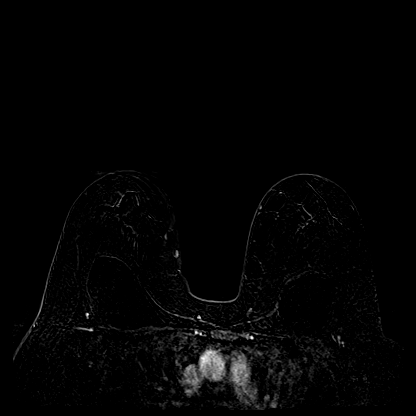
[im 192/256]
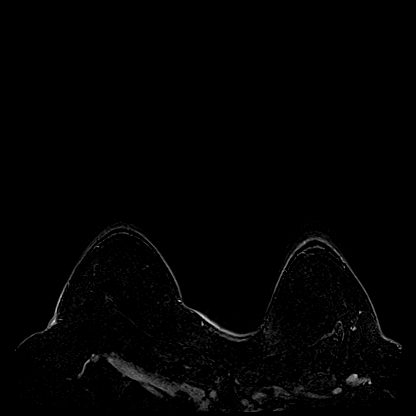
[im 256/256]
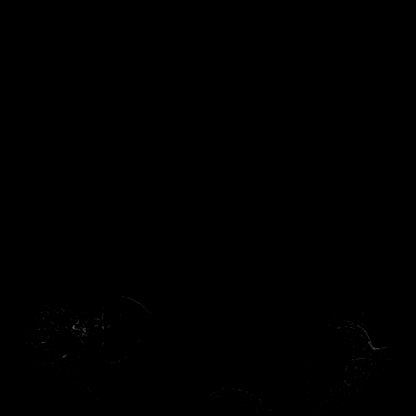

[Series 8: fl3d post-cm 20 · axial · 230.4mm · 0.96mm/px · 1 of 1 slices shown (3 of 3)]
[im 1/1]
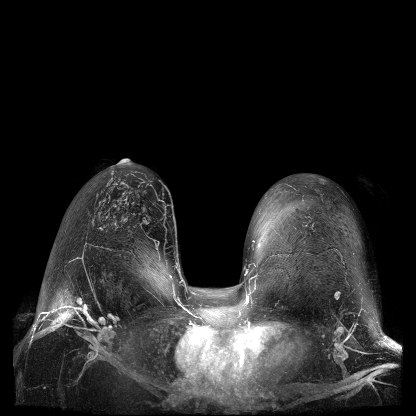

[Series 9: fl3d post-cm 3min · axial · 0.9mm · 0.96mm/px · z∈[-108,+122]mm · 6 of 256 slices shown]
[im 1/256]
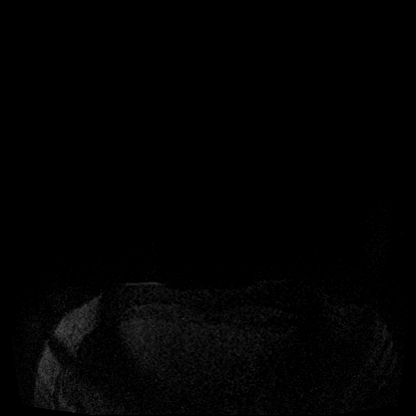
[im 52/256]
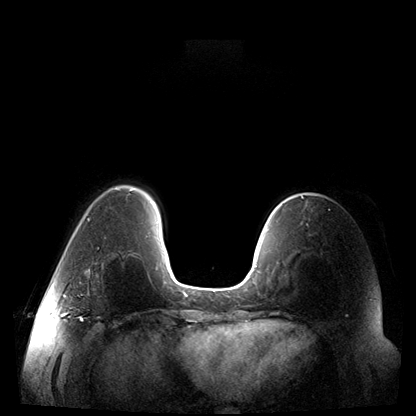
[im 103/256]
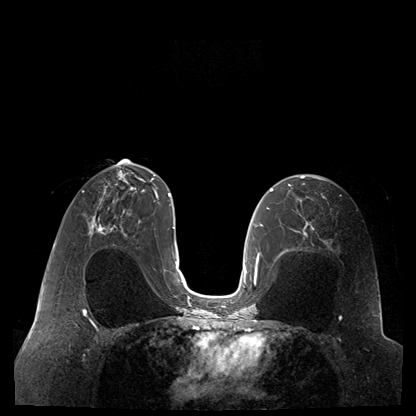
[im 154/256]
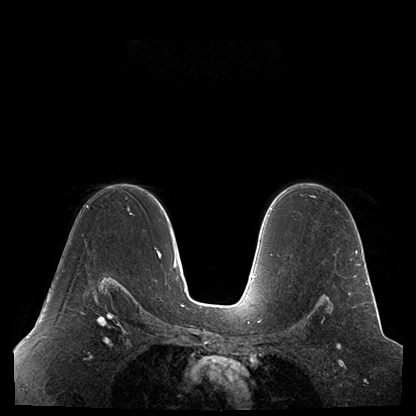
[im 205/256]
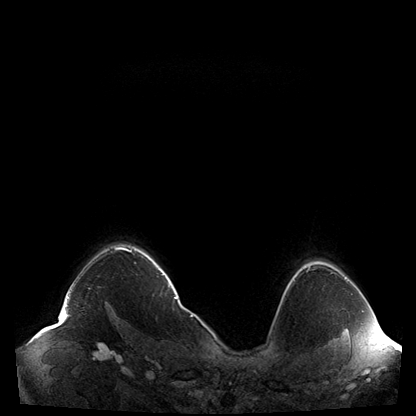
[im 256/256]
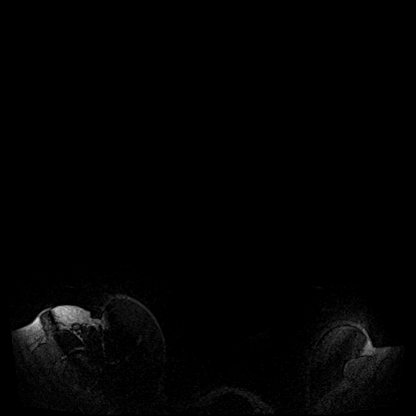

[Series 10: fl3d post-cm 3min_sub · axial · 0.9mm · 0.96mm/px · z∈[-108,-62]mm · 2 of 256 slices shown]
[im 1/256]
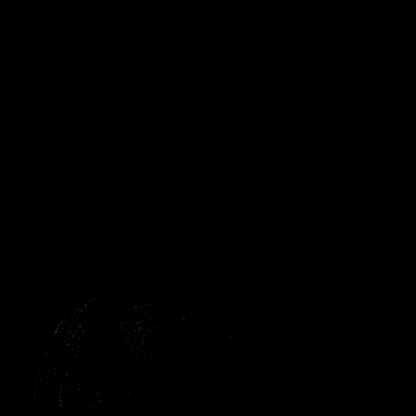
[im 52/256]
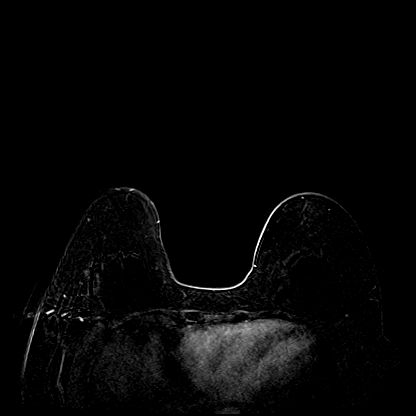

[30 of 48 positions shown; findings below may reference images not displayed]

Three-dimensional MR images were rendered by post-processing of the
original MR data on an independent workstation. The
three-dimensional MR images were interpreted, and findings are
reported in the following complete MRI report for this study. Three
dimensional images were evaluated at the independent interpreting
workstation using the DynaCAD thin client.
FINDINGS: Breast composition: b. Scattered fibroglandular tissue.

Background parenchymal enhancement: Minimal

Right breast: Retropectoral saline implant. Within the LATERAL
portion of the RIGHT breast, middle depth, there is an enhancing
oval circumscribed mass which measures 0.3 x 0.4 x 0.5 centimeters.
Enhancement shows persistent kinetics, and the appearance is stable.
No new findings in the RIGHT breast.

Left breast: No mass or abnormal enhancement.

Lymph nodes: No abnormal appearing lymph nodes.

Ancillary findings:  None.
IMPRESSION: 1. Stable appearance of probably benign mass in the RIGHT breast.
2. No new or suspicious findings in either breast.
3. Bilateral retropectoral saline implants.

RECOMMENDATION:
Recommend 1 additional MRI in 1 year to assess stability of RIGHT
breast mass.

BI-RADS CATEGORY  3: Probably benign.

## 2021-03-13 DIAGNOSIS — J069 Acute upper respiratory infection, unspecified: Secondary | ICD-10-CM | POA: Diagnosis not present

## 2021-03-14 DIAGNOSIS — R062 Wheezing: Secondary | ICD-10-CM | POA: Diagnosis not present

## 2021-03-14 DIAGNOSIS — E108 Type 1 diabetes mellitus with unspecified complications: Secondary | ICD-10-CM | POA: Diagnosis not present

## 2021-03-14 DIAGNOSIS — R059 Cough, unspecified: Secondary | ICD-10-CM | POA: Diagnosis not present

## 2021-03-14 DIAGNOSIS — J988 Other specified respiratory disorders: Secondary | ICD-10-CM | POA: Diagnosis not present

## 2021-03-20 DIAGNOSIS — R062 Wheezing: Secondary | ICD-10-CM | POA: Diagnosis not present

## 2021-03-20 DIAGNOSIS — J22 Unspecified acute lower respiratory infection: Secondary | ICD-10-CM | POA: Diagnosis not present

## 2021-03-27 DIAGNOSIS — E109 Type 1 diabetes mellitus without complications: Secondary | ICD-10-CM | POA: Diagnosis not present

## 2021-04-12 DIAGNOSIS — Z01818 Encounter for other preprocedural examination: Secondary | ICD-10-CM | POA: Diagnosis not present

## 2021-04-30 DIAGNOSIS — E109 Type 1 diabetes mellitus without complications: Secondary | ICD-10-CM | POA: Diagnosis not present

## 2021-07-10 DIAGNOSIS — E109 Type 1 diabetes mellitus without complications: Secondary | ICD-10-CM | POA: Diagnosis not present

## 2021-07-24 DIAGNOSIS — E119 Type 2 diabetes mellitus without complications: Secondary | ICD-10-CM | POA: Diagnosis not present

## 2021-09-06 DIAGNOSIS — E109 Type 1 diabetes mellitus without complications: Secondary | ICD-10-CM | POA: Diagnosis not present

## 2021-09-20 DIAGNOSIS — Z124 Encounter for screening for malignant neoplasm of cervix: Secondary | ICD-10-CM | POA: Diagnosis not present

## 2021-09-20 DIAGNOSIS — Z113 Encounter for screening for infections with a predominantly sexual mode of transmission: Secondary | ICD-10-CM | POA: Diagnosis not present

## 2021-09-20 DIAGNOSIS — Z01419 Encounter for gynecological examination (general) (routine) without abnormal findings: Secondary | ICD-10-CM | POA: Diagnosis not present

## 2021-09-20 DIAGNOSIS — Z6837 Body mass index (BMI) 37.0-37.9, adult: Secondary | ICD-10-CM | POA: Diagnosis not present

## 2021-09-20 DIAGNOSIS — Z1151 Encounter for screening for human papillomavirus (HPV): Secondary | ICD-10-CM | POA: Diagnosis not present

## 2021-10-12 DIAGNOSIS — N8003 Adenomyosis of the uterus: Secondary | ICD-10-CM | POA: Diagnosis not present

## 2021-10-12 DIAGNOSIS — Z309 Encounter for contraceptive management, unspecified: Secondary | ICD-10-CM | POA: Diagnosis not present

## 2021-10-12 DIAGNOSIS — N946 Dysmenorrhea, unspecified: Secondary | ICD-10-CM | POA: Diagnosis not present

## 2021-10-12 DIAGNOSIS — N83209 Unspecified ovarian cyst, unspecified side: Secondary | ICD-10-CM | POA: Diagnosis not present

## 2021-10-16 DIAGNOSIS — Z794 Long term (current) use of insulin: Secondary | ICD-10-CM | POA: Diagnosis not present

## 2021-10-16 DIAGNOSIS — E109 Type 1 diabetes mellitus without complications: Secondary | ICD-10-CM | POA: Diagnosis not present

## 2022-01-11 DIAGNOSIS — M25562 Pain in left knee: Secondary | ICD-10-CM | POA: Diagnosis not present

## 2022-01-11 DIAGNOSIS — M25561 Pain in right knee: Secondary | ICD-10-CM | POA: Diagnosis not present

## 2022-01-11 DIAGNOSIS — M722 Plantar fascial fibromatosis: Secondary | ICD-10-CM | POA: Diagnosis not present

## 2022-01-15 DIAGNOSIS — I10 Essential (primary) hypertension: Secondary | ICD-10-CM | POA: Diagnosis not present

## 2022-01-15 DIAGNOSIS — R946 Abnormal results of thyroid function studies: Secondary | ICD-10-CM | POA: Diagnosis not present

## 2022-01-15 DIAGNOSIS — E109 Type 1 diabetes mellitus without complications: Secondary | ICD-10-CM | POA: Diagnosis not present

## 2022-01-21 DIAGNOSIS — R062 Wheezing: Secondary | ICD-10-CM | POA: Diagnosis not present

## 2022-01-21 DIAGNOSIS — R519 Headache, unspecified: Secondary | ICD-10-CM | POA: Diagnosis not present

## 2022-01-21 DIAGNOSIS — R051 Acute cough: Secondary | ICD-10-CM | POA: Diagnosis not present

## 2022-01-21 DIAGNOSIS — E109 Type 1 diabetes mellitus without complications: Secondary | ICD-10-CM | POA: Diagnosis not present

## 2022-01-30 ENCOUNTER — Other Ambulatory Visit: Payer: Self-pay | Admitting: General Surgery

## 2022-01-30 DIAGNOSIS — Z1231 Encounter for screening mammogram for malignant neoplasm of breast: Secondary | ICD-10-CM

## 2022-02-01 ENCOUNTER — Other Ambulatory Visit: Payer: Self-pay | Admitting: General Surgery

## 2022-02-01 ENCOUNTER — Ambulatory Visit
Admission: RE | Admit: 2022-02-01 | Discharge: 2022-02-01 | Disposition: A | Discharge status: Home / Self Care | Payer: BC Managed Care – PPO | Source: Ambulatory Visit | Attending: General Surgery | Admitting: General Surgery

## 2022-02-01 DIAGNOSIS — Z794 Long term (current) use of insulin: Secondary | ICD-10-CM | POA: Diagnosis not present

## 2022-02-01 DIAGNOSIS — Z1231 Encounter for screening mammogram for malignant neoplasm of breast: Secondary | ICD-10-CM

## 2022-02-01 DIAGNOSIS — E109 Type 1 diabetes mellitus without complications: Secondary | ICD-10-CM | POA: Diagnosis not present

## 2022-02-05 ENCOUNTER — Ambulatory Visit: Payer: BC Managed Care – PPO

## 2022-05-13 DIAGNOSIS — R509 Fever, unspecified: Secondary | ICD-10-CM | POA: Diagnosis not present

## 2022-05-13 DIAGNOSIS — E108 Type 1 diabetes mellitus with unspecified complications: Secondary | ICD-10-CM | POA: Diagnosis not present

## 2022-05-13 DIAGNOSIS — N924 Excessive bleeding in the premenopausal period: Secondary | ICD-10-CM | POA: Diagnosis not present

## 2022-05-13 DIAGNOSIS — R61 Generalized hyperhidrosis: Secondary | ICD-10-CM | POA: Diagnosis not present

## 2022-05-14 DIAGNOSIS — D179 Benign lipomatous neoplasm, unspecified: Secondary | ICD-10-CM | POA: Diagnosis not present

## 2022-05-14 DIAGNOSIS — E108 Type 1 diabetes mellitus with unspecified complications: Secondary | ICD-10-CM | POA: Diagnosis not present

## 2022-05-14 DIAGNOSIS — K429 Umbilical hernia without obstruction or gangrene: Secondary | ICD-10-CM | POA: Diagnosis not present

## 2022-05-20 DIAGNOSIS — E109 Type 1 diabetes mellitus without complications: Secondary | ICD-10-CM | POA: Diagnosis not present

## 2022-05-20 DIAGNOSIS — E669 Obesity, unspecified: Secondary | ICD-10-CM | POA: Diagnosis not present

## 2022-05-20 DIAGNOSIS — F330 Major depressive disorder, recurrent, mild: Secondary | ICD-10-CM | POA: Diagnosis not present

## 2022-05-20 DIAGNOSIS — F900 Attention-deficit hyperactivity disorder, predominantly inattentive type: Secondary | ICD-10-CM | POA: Diagnosis not present

## 2022-06-20 DIAGNOSIS — M9903 Segmental and somatic dysfunction of lumbar region: Secondary | ICD-10-CM | POA: Diagnosis not present

## 2022-06-20 DIAGNOSIS — M9906 Segmental and somatic dysfunction of lower extremity: Secondary | ICD-10-CM | POA: Diagnosis not present

## 2022-06-20 DIAGNOSIS — M5386 Other specified dorsopathies, lumbar region: Secondary | ICD-10-CM | POA: Diagnosis not present

## 2022-06-20 DIAGNOSIS — M25571 Pain in right ankle and joints of right foot: Secondary | ICD-10-CM | POA: Diagnosis not present

## 2022-06-25 DIAGNOSIS — M2142 Flat foot [pes planus] (acquired), left foot: Secondary | ICD-10-CM | POA: Diagnosis not present

## 2022-06-25 DIAGNOSIS — M9903 Segmental and somatic dysfunction of lumbar region: Secondary | ICD-10-CM | POA: Diagnosis not present

## 2022-06-25 DIAGNOSIS — M5386 Other specified dorsopathies, lumbar region: Secondary | ICD-10-CM | POA: Diagnosis not present

## 2022-06-25 DIAGNOSIS — M2141 Flat foot [pes planus] (acquired), right foot: Secondary | ICD-10-CM | POA: Diagnosis not present

## 2022-06-27 DIAGNOSIS — M9903 Segmental and somatic dysfunction of lumbar region: Secondary | ICD-10-CM | POA: Diagnosis not present

## 2022-06-27 DIAGNOSIS — M25571 Pain in right ankle and joints of right foot: Secondary | ICD-10-CM | POA: Diagnosis not present

## 2022-06-27 DIAGNOSIS — M5386 Other specified dorsopathies, lumbar region: Secondary | ICD-10-CM | POA: Diagnosis not present

## 2022-06-27 DIAGNOSIS — M9906 Segmental and somatic dysfunction of lower extremity: Secondary | ICD-10-CM | POA: Diagnosis not present

## 2022-07-02 DIAGNOSIS — M25571 Pain in right ankle and joints of right foot: Secondary | ICD-10-CM | POA: Diagnosis not present

## 2022-07-02 DIAGNOSIS — M5386 Other specified dorsopathies, lumbar region: Secondary | ICD-10-CM | POA: Diagnosis not present

## 2022-07-02 DIAGNOSIS — M9903 Segmental and somatic dysfunction of lumbar region: Secondary | ICD-10-CM | POA: Diagnosis not present

## 2022-07-02 DIAGNOSIS — M9906 Segmental and somatic dysfunction of lower extremity: Secondary | ICD-10-CM | POA: Diagnosis not present

## 2022-07-08 DIAGNOSIS — M5386 Other specified dorsopathies, lumbar region: Secondary | ICD-10-CM | POA: Diagnosis not present

## 2022-07-08 DIAGNOSIS — M9903 Segmental and somatic dysfunction of lumbar region: Secondary | ICD-10-CM | POA: Diagnosis not present

## 2022-07-08 DIAGNOSIS — M9906 Segmental and somatic dysfunction of lower extremity: Secondary | ICD-10-CM | POA: Diagnosis not present

## 2022-07-08 DIAGNOSIS — M25571 Pain in right ankle and joints of right foot: Secondary | ICD-10-CM | POA: Diagnosis not present

## 2022-07-10 DIAGNOSIS — M9903 Segmental and somatic dysfunction of lumbar region: Secondary | ICD-10-CM | POA: Diagnosis not present

## 2022-07-10 DIAGNOSIS — M25571 Pain in right ankle and joints of right foot: Secondary | ICD-10-CM | POA: Diagnosis not present

## 2022-07-10 DIAGNOSIS — M5386 Other specified dorsopathies, lumbar region: Secondary | ICD-10-CM | POA: Diagnosis not present

## 2022-07-10 DIAGNOSIS — M9906 Segmental and somatic dysfunction of lower extremity: Secondary | ICD-10-CM | POA: Diagnosis not present

## 2022-07-15 DIAGNOSIS — M9903 Segmental and somatic dysfunction of lumbar region: Secondary | ICD-10-CM | POA: Diagnosis not present

## 2022-07-15 DIAGNOSIS — M9906 Segmental and somatic dysfunction of lower extremity: Secondary | ICD-10-CM | POA: Diagnosis not present

## 2022-07-15 DIAGNOSIS — M25571 Pain in right ankle and joints of right foot: Secondary | ICD-10-CM | POA: Diagnosis not present

## 2022-07-15 DIAGNOSIS — M5386 Other specified dorsopathies, lumbar region: Secondary | ICD-10-CM | POA: Diagnosis not present

## 2022-07-17 DIAGNOSIS — M9906 Segmental and somatic dysfunction of lower extremity: Secondary | ICD-10-CM | POA: Diagnosis not present

## 2022-07-17 DIAGNOSIS — M5386 Other specified dorsopathies, lumbar region: Secondary | ICD-10-CM | POA: Diagnosis not present

## 2022-07-17 DIAGNOSIS — M25571 Pain in right ankle and joints of right foot: Secondary | ICD-10-CM | POA: Diagnosis not present

## 2022-07-17 DIAGNOSIS — M9903 Segmental and somatic dysfunction of lumbar region: Secondary | ICD-10-CM | POA: Diagnosis not present

## 2022-07-23 DIAGNOSIS — M9906 Segmental and somatic dysfunction of lower extremity: Secondary | ICD-10-CM | POA: Diagnosis not present

## 2022-07-23 DIAGNOSIS — M9903 Segmental and somatic dysfunction of lumbar region: Secondary | ICD-10-CM | POA: Diagnosis not present

## 2022-07-23 DIAGNOSIS — M5386 Other specified dorsopathies, lumbar region: Secondary | ICD-10-CM | POA: Diagnosis not present

## 2022-07-23 DIAGNOSIS — M25571 Pain in right ankle and joints of right foot: Secondary | ICD-10-CM | POA: Diagnosis not present

## 2022-07-29 DIAGNOSIS — M9903 Segmental and somatic dysfunction of lumbar region: Secondary | ICD-10-CM | POA: Diagnosis not present

## 2022-07-29 DIAGNOSIS — M25571 Pain in right ankle and joints of right foot: Secondary | ICD-10-CM | POA: Diagnosis not present

## 2022-07-29 DIAGNOSIS — M5386 Other specified dorsopathies, lumbar region: Secondary | ICD-10-CM | POA: Diagnosis not present

## 2022-07-29 DIAGNOSIS — M9906 Segmental and somatic dysfunction of lower extremity: Secondary | ICD-10-CM | POA: Diagnosis not present

## 2022-08-05 DIAGNOSIS — M9906 Segmental and somatic dysfunction of lower extremity: Secondary | ICD-10-CM | POA: Diagnosis not present

## 2022-08-05 DIAGNOSIS — M5386 Other specified dorsopathies, lumbar region: Secondary | ICD-10-CM | POA: Diagnosis not present

## 2022-08-05 DIAGNOSIS — M25571 Pain in right ankle and joints of right foot: Secondary | ICD-10-CM | POA: Diagnosis not present

## 2022-08-05 DIAGNOSIS — M9903 Segmental and somatic dysfunction of lumbar region: Secondary | ICD-10-CM | POA: Diagnosis not present

## 2022-08-12 DIAGNOSIS — M9906 Segmental and somatic dysfunction of lower extremity: Secondary | ICD-10-CM | POA: Diagnosis not present

## 2022-08-12 DIAGNOSIS — M9903 Segmental and somatic dysfunction of lumbar region: Secondary | ICD-10-CM | POA: Diagnosis not present

## 2022-08-12 DIAGNOSIS — M25571 Pain in right ankle and joints of right foot: Secondary | ICD-10-CM | POA: Diagnosis not present

## 2022-08-12 DIAGNOSIS — M5386 Other specified dorsopathies, lumbar region: Secondary | ICD-10-CM | POA: Diagnosis not present

## 2022-08-19 DIAGNOSIS — M25571 Pain in right ankle and joints of right foot: Secondary | ICD-10-CM | POA: Diagnosis not present

## 2022-08-19 DIAGNOSIS — M9903 Segmental and somatic dysfunction of lumbar region: Secondary | ICD-10-CM | POA: Diagnosis not present

## 2022-08-19 DIAGNOSIS — M5386 Other specified dorsopathies, lumbar region: Secondary | ICD-10-CM | POA: Diagnosis not present

## 2022-08-19 DIAGNOSIS — M9906 Segmental and somatic dysfunction of lower extremity: Secondary | ICD-10-CM | POA: Diagnosis not present

## 2022-08-22 DIAGNOSIS — M9903 Segmental and somatic dysfunction of lumbar region: Secondary | ICD-10-CM | POA: Diagnosis not present

## 2022-08-22 DIAGNOSIS — M25571 Pain in right ankle and joints of right foot: Secondary | ICD-10-CM | POA: Diagnosis not present

## 2022-08-22 DIAGNOSIS — M5386 Other specified dorsopathies, lumbar region: Secondary | ICD-10-CM | POA: Diagnosis not present

## 2022-08-22 DIAGNOSIS — M9906 Segmental and somatic dysfunction of lower extremity: Secondary | ICD-10-CM | POA: Diagnosis not present

## 2022-09-25 DIAGNOSIS — E109 Type 1 diabetes mellitus without complications: Secondary | ICD-10-CM | POA: Diagnosis not present

## 2022-09-25 DIAGNOSIS — I10 Essential (primary) hypertension: Secondary | ICD-10-CM | POA: Diagnosis not present

## 2022-11-08 DIAGNOSIS — M25562 Pain in left knee: Secondary | ICD-10-CM | POA: Diagnosis not present

## 2022-11-08 DIAGNOSIS — S8002XA Contusion of left knee, initial encounter: Secondary | ICD-10-CM | POA: Diagnosis not present

## 2022-11-08 DIAGNOSIS — S80212A Abrasion, left knee, initial encounter: Secondary | ICD-10-CM | POA: Diagnosis not present

## 2022-11-25 DIAGNOSIS — Z6841 Body Mass Index (BMI) 40.0 and over, adult: Secondary | ICD-10-CM | POA: Diagnosis not present

## 2022-11-25 DIAGNOSIS — Z01419 Encounter for gynecological examination (general) (routine) without abnormal findings: Secondary | ICD-10-CM | POA: Diagnosis not present

## 2022-12-17 DIAGNOSIS — R051 Acute cough: Secondary | ICD-10-CM | POA: Diagnosis not present

## 2022-12-17 DIAGNOSIS — J209 Acute bronchitis, unspecified: Secondary | ICD-10-CM | POA: Diagnosis not present

## 2022-12-24 DIAGNOSIS — E119 Type 2 diabetes mellitus without complications: Secondary | ICD-10-CM | POA: Diagnosis not present

## 2023-01-15 DIAGNOSIS — E109 Type 1 diabetes mellitus without complications: Secondary | ICD-10-CM | POA: Diagnosis not present

## 2023-01-15 DIAGNOSIS — I10 Essential (primary) hypertension: Secondary | ICD-10-CM | POA: Diagnosis not present

## 2023-02-20 DIAGNOSIS — E109 Type 1 diabetes mellitus without complications: Secondary | ICD-10-CM | POA: Diagnosis not present

## 2023-02-20 DIAGNOSIS — I10 Essential (primary) hypertension: Secondary | ICD-10-CM | POA: Diagnosis not present

## 2023-02-26 ENCOUNTER — Other Ambulatory Visit: Payer: Self-pay | Admitting: Obstetrics and Gynecology

## 2023-02-26 DIAGNOSIS — Z1231 Encounter for screening mammogram for malignant neoplasm of breast: Secondary | ICD-10-CM

## 2023-03-13 ENCOUNTER — Ambulatory Visit
Admission: RE | Admit: 2023-03-13 | Discharge: 2023-03-13 | Disposition: A | Discharge status: Home / Self Care | Payer: Federal, State, Local not specified - PPO | Source: Ambulatory Visit | Attending: Obstetrics and Gynecology | Admitting: Obstetrics and Gynecology

## 2023-03-13 DIAGNOSIS — Z1231 Encounter for screening mammogram for malignant neoplasm of breast: Secondary | ICD-10-CM | POA: Diagnosis not present

## 2023-04-21 DIAGNOSIS — Z3202 Encounter for pregnancy test, result negative: Secondary | ICD-10-CM | POA: Diagnosis not present

## 2023-04-21 DIAGNOSIS — Z30430 Encounter for insertion of intrauterine contraceptive device: Secondary | ICD-10-CM | POA: Diagnosis not present

## 2023-05-28 DIAGNOSIS — I10 Essential (primary) hypertension: Secondary | ICD-10-CM | POA: Diagnosis not present

## 2023-05-28 DIAGNOSIS — E109 Type 1 diabetes mellitus without complications: Secondary | ICD-10-CM | POA: Diagnosis not present

## 2023-06-09 DIAGNOSIS — Z30431 Encounter for routine checking of intrauterine contraceptive device: Secondary | ICD-10-CM | POA: Diagnosis not present

## 2023-08-01 DIAGNOSIS — I10 Essential (primary) hypertension: Secondary | ICD-10-CM | POA: Diagnosis not present

## 2023-08-01 DIAGNOSIS — E109 Type 1 diabetes mellitus without complications: Secondary | ICD-10-CM | POA: Diagnosis not present

## 2023-08-01 DIAGNOSIS — R946 Abnormal results of thyroid function studies: Secondary | ICD-10-CM | POA: Diagnosis not present

## 2023-08-01 DIAGNOSIS — Z0000 Encounter for general adult medical examination without abnormal findings: Secondary | ICD-10-CM | POA: Diagnosis not present

## 2023-08-01 DIAGNOSIS — M2240 Chondromalacia patellae, unspecified knee: Secondary | ICD-10-CM | POA: Diagnosis not present

## 2023-08-27 DIAGNOSIS — E109 Type 1 diabetes mellitus without complications: Secondary | ICD-10-CM | POA: Diagnosis not present

## 2023-08-27 DIAGNOSIS — I10 Essential (primary) hypertension: Secondary | ICD-10-CM | POA: Diagnosis not present

## 2023-09-15 DIAGNOSIS — I10 Essential (primary) hypertension: Secondary | ICD-10-CM | POA: Diagnosis not present

## 2023-09-25 ENCOUNTER — Other Ambulatory Visit (HOSPITAL_COMMUNITY): Payer: Self-pay | Admitting: Registered Nurse

## 2023-09-25 ENCOUNTER — Ambulatory Visit (HOSPITAL_BASED_OUTPATIENT_CLINIC_OR_DEPARTMENT_OTHER)
Admission: RE | Admit: 2023-09-25 | Discharge: 2023-09-25 | Disposition: A | Discharge status: Home / Self Care | Source: Ambulatory Visit | Attending: Registered Nurse | Admitting: Registered Nurse

## 2023-09-25 DIAGNOSIS — R202 Paresthesia of skin: Secondary | ICD-10-CM

## 2023-09-25 DIAGNOSIS — R519 Headache, unspecified: Secondary | ICD-10-CM | POA: Insufficient documentation

## 2023-09-25 DIAGNOSIS — R9082 White matter disease, unspecified: Secondary | ICD-10-CM | POA: Diagnosis not present

## 2023-09-30 DIAGNOSIS — E1165 Type 2 diabetes mellitus with hyperglycemia: Secondary | ICD-10-CM | POA: Diagnosis not present

## 2023-10-24 ENCOUNTER — Other Ambulatory Visit: Payer: Self-pay | Admitting: Medical Genetics

## 2023-11-03 ENCOUNTER — Other Ambulatory Visit

## 2023-11-03 ENCOUNTER — Other Ambulatory Visit
Admission: RE | Admit: 2023-11-03 | Discharge: 2023-11-03 | Disposition: A | Discharge status: Home / Self Care | Payer: Self-pay | Source: Ambulatory Visit | Attending: Medical Genetics | Admitting: Medical Genetics

## 2023-11-11 ENCOUNTER — Telehealth: Payer: Self-pay | Admitting: Neurology

## 2023-11-11 NOTE — Telephone Encounter (Signed)
 Patient called to reschedule appointment due to Dr. Ines no longer with [facility].,

## 2023-11-14 LAB — GENECONNECT MOLECULAR SCREEN: Genetic Analysis Overall Interpretation: NEGATIVE

## 2023-11-18 ENCOUNTER — Institutional Professional Consult (permissible substitution): Admitting: Neurology

## 2023-11-19 DIAGNOSIS — M25561 Pain in right knee: Secondary | ICD-10-CM | POA: Diagnosis not present

## 2023-11-19 DIAGNOSIS — M25562 Pain in left knee: Secondary | ICD-10-CM | POA: Diagnosis not present

## 2023-11-22 DIAGNOSIS — M25561 Pain in right knee: Secondary | ICD-10-CM | POA: Diagnosis not present

## 2023-11-22 DIAGNOSIS — M25562 Pain in left knee: Secondary | ICD-10-CM | POA: Diagnosis not present

## 2023-11-25 ENCOUNTER — Encounter: Payer: Self-pay | Admitting: Neurology

## 2023-11-25 ENCOUNTER — Ambulatory Visit: Admitting: Neurology

## 2023-11-25 VITALS — BP 152/88 | HR 82 | Ht 61.0 in | Wt 199.0 lb

## 2023-11-25 DIAGNOSIS — R5383 Other fatigue: Secondary | ICD-10-CM | POA: Insufficient documentation

## 2023-11-25 DIAGNOSIS — F32A Depression, unspecified: Secondary | ICD-10-CM | POA: Diagnosis not present

## 2023-11-25 DIAGNOSIS — E109 Type 1 diabetes mellitus without complications: Secondary | ICD-10-CM | POA: Diagnosis not present

## 2023-11-25 DIAGNOSIS — D509 Iron deficiency anemia, unspecified: Secondary | ICD-10-CM | POA: Insufficient documentation

## 2023-11-25 DIAGNOSIS — R519 Headache, unspecified: Secondary | ICD-10-CM | POA: Insufficient documentation

## 2023-11-25 NOTE — Patient Instructions (Signed)
 Quality Sleep Information, Adult Quality sleep is important for your mental and physical health. It also improves your quality of life. Quality sleep means you: Are asleep for most of the time you are in bed. Fall asleep within 30 minutes. Wake up no more than once a night. Are awake for no longer than 20 minutes if you do wake up during the night. Most adults need 7-8 hours of quality sleep each night. How can poor sleep affect me? If you do not get enough quality sleep, you may have: Mood swings. Daytime sleepiness. Decreased alertness, reaction time, and concentration. Sleep disorders, such as insomnia and sleep apnea. Difficulty with: Solving problems. Coping with stress. Paying attention. These issues may affect your performance and productivity at work, school, and home. Lack of sleep may also put you at higher risk for accidents, suicide, and risky behaviors. If you do not get quality sleep, you may also be at higher risk for several health problems, including: Infections. Type 2 diabetes. Heart disease. High blood pressure. Obesity. Worsening of long-term conditions, like arthritis, kidney disease, depression, Parkinson's disease, and epilepsy. What actions can I take to get more quality sleep? Sleep schedule and routine Stick to a sleep schedule. Go to sleep and wake up at about the same time each day. Do not try to sleep less on weekdays and make up for lost sleep on weekends. This does not work. Limit naps during the day to 30 minutes or less. Do not take naps in the late afternoon. Make time to relax before bed. Reading, listening to music, or taking a hot bath promotes quality sleep. Make your bedroom a place that promotes quality sleep. Keep your bedroom dark, quiet, and at a comfortable room temperature. Make sure your bed is comfortable. Avoid using electronic devices that give off bright blue light for 30 minutes before bedtime. Your brain perceives bright blue light  as sunlight. This includes television, phones, and computers. If you are lying awake in bed for longer than 20 minutes, get up and do a relaxing activity until you feel sleepy. Lifestyle     Try to get at least 30 minutes of exercise on most days. Do not exercise 2-3 hours before going to bed. Do not use any products that contain nicotine or tobacco. These products include cigarettes, chewing tobacco, and vaping devices, such as e-cigarettes. If you need help quitting, ask your health care provider. Do not drink caffeinated beverages for at least 8 hours before going to bed. Coffee, tea, and some sodas contain caffeine. Do not drink alcohol or eat large meals close to bedtime. Try to get at least 30 minutes of sunlight every day. Morning sunlight is best. Medical concerns Work with your health care provider to treat medical conditions that may affect sleeping, such as: Nasal obstruction. Snoring. Sleep apnea and other sleep disorders. Talk to your health care provider if you think any of your prescription medicines may cause you to have difficulty falling or staying asleep. If you have sleep problems, talk with a sleep consultant. If you think you have a sleep disorder, talk with your health care provider about getting evaluated by a specialist. Where to find more information Sleep Foundation: sleepfoundation.org American Academy of Sleep Medicine: aasm.org Centers for Disease Control and Prevention (CDC): TonerPromos.no Contact a health care provider if: You have trouble getting to sleep or staying asleep. You often wake up very early in the morning and cannot get back to sleep. You have daytime sleepiness. You  have daytime sleep attacks of suddenly falling asleep and sudden muscle weakness (narcolepsy). You have a tingling sensation in your legs with a strong urge to move your legs (restless legs syndrome). You stop breathing briefly during sleep (sleep apnea). You think you have a sleep  disorder or are taking a medicine that is affecting your quality of sleep. Summary Most adults need 7-8 hours of quality sleep each night. Getting enough quality sleep is important for your mental and physical health. Make your bedroom a place that promotes quality sleep, and avoid things that may cause you to have poor sleep, such as alcohol, caffeine, smoking, or large meals. Talk to your health care provider if you have trouble falling asleep or staying asleep. This information is not intended to replace advice given to you by your health care provider. Make sure you discuss any questions you have with your health care provider. Document Revised: 05/16/2021 Document Reviewed: 05/16/2021 Elsevier Patient Education  2024 Elsevier Inc.  Fatigue If you have fatigue, you feel tired all the time and have a lack of energy or a lack of motivation. Fatigue may make it difficult to start or complete tasks because of exhaustion. Occasional or mild fatigue is often a normal response to activity or life. However, long-term (chronic) or extreme fatigue may be a symptom of a medical condition such as: Depression. Not having enough red blood cells or hemoglobin in the blood (anemia). A problem with a small gland located in the lower front part of the neck (thyroid  disorder). Rheumatologic conditions. These are problems related to the body's defense system (immune system). Infections, especially certain viral infections. Fatigue can also lead to negative health outcomes over time. Follow these instructions at home: Medicines Take over-the-counter and prescription medicines only as told by your health care provider. Take a multivitamin if told by your health care provider. Do not use herbal or dietary supplements unless they are approved by your health care provider. Eating and drinking  Avoid heavy meals in the evening. Eat a well-balanced diet, which includes lean proteins, whole grains, plenty of  fruits and vegetables, and low-fat dairy products. Avoid eating or drinking too many products with caffeine in them. Avoid alcohol. Drink enough fluid to keep your urine pale yellow. Activity  Exercise regularly, as told by your health care provider. Use or practice techniques to help you relax, such as yoga, tai chi, meditation, or massage therapy. Lifestyle Change situations that cause you stress. Try to keep your work and personal schedules in balance. Do not use recreational or illegal drugs. General instructions Monitor your fatigue for any changes. Go to bed and get up at the same time every day. Avoid fatigue by pacing yourself during the day and getting enough sleep at night. Maintain a healthy weight. Contact a health care provider if: Your fatigue does not get better. You have a fever. You suddenly lose or gain weight. You have headaches. You have trouble falling asleep or sleeping through the night. You feel angry, guilty, anxious, or sad. You have swelling in your legs or another part of your body. Get help right away if: You feel confused, feel like you might faint, or faint. Your vision is blurry or you have a severe headache. You have severe pain in your abdomen, your back, or the area between your waist and hips (pelvis). You have chest pain, shortness of breath, or an irregular or fast heartbeat. You are unable to urinate, or you urinate less than normal. You  have abnormal bleeding from the rectum, nose, lungs, nipples, or, if you are female, the vagina. You vomit blood. You have thoughts about hurting yourself or others. These symptoms may be an emergency. Get help right away. Call 911. Do not wait to see if the symptoms will go away. Do not drive yourself to the hospital. Get help right away if you feel like you may hurt yourself or others, or have thoughts about taking your own life. Go to your nearest emergency room or: Call 911. Call the National Suicide  Prevention Lifeline at 305 460 8819 or 988. This is open 24 hours a day. Text the Crisis Text Line at 323-352-2283. Summary If you have fatigue, you feel tired all the time and have a lack of energy or a lack of motivation. Fatigue may make it difficult to start or complete tasks because of exhaustion. Long-term (chronic) or extreme fatigue may be a symptom of a medical condition. Exercise regularly, as told by your health care provider. Change situations that cause you stress. Try to keep your work and personal schedules in balance. This information is not intended to replace advice given to you by your health care provider. Make sure you discuss any questions you have with your health care provider. Document Revised: 11/13/2020 Document Reviewed: 11/13/2020 Elsevier Patient Education  2024 [employer].

## 2023-11-25 NOTE — Progress Notes (Addendum)
 @[facility]   Provider:  Dedra Gores, MD  Primary Care Physician:  Nichole Senior, MD [address] [place] [address]  Referring Provider: Nichole Senior, Md [address] [place],  [place] [address]        Chief Concern for this Consultation:   Patient presents with     ESS :    FSS :  snores,fatigue,headaches, takes HCTZ and has controlled BP       HPI: I have the pleasure of meeting with Gail Jones , on 11/25/23 , who is a 43 y.o.  female patient,  seen upon a referral by Dr Nichole   for a  Sleep Medicine Consultation.  The patient's referral information asked for a work for OSA, based on snoring and headaches. Has ankle edema.   Chief concern according to patient: Some Headache are in AM,  upon waking up,  dry mouth, and nasal congestion is chronic.   has a past medical history of Asthma in childhood and Diabetes mellitus  Type 1, insulin  pump, without complication (HCC).   Sleep relevant medical/ surgical and symptom history: The patient reports onset of snoring and mouth breathing symptoms over a time period of years. ENT  problems: (Sinusitis, allergic rhinitis),  Obesity, lost 25 pounds on wgovy,  Autoimmune disorders; DM 2,  Anemia, iron deficiency. Depression.     Family medical history: There are  biological family members affected by Sleep apnea ( father on CPAP ), excessive daytime sleepiness (father ).     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  The patient currently works days only but used to work in shifts (Chief Technology Officer,) until 2023. The workplace involves physical activity, outdoor activity, travel.  Nicotine use: /.  ETOH use: seldomly ,  Caffeine intake in form of: Coffee (1-2 cups in AM), Soft drinks (/), Tea ( /)  rare Energy drinks ( including those containing  taurine ). Caffeine may be last consumed at dinner  time .  Exercises regularly in form of walking .      Sleep habits and routines are as follows: The patient's dinner time is around 5.30 PM.  The patient goes to bed at, or close to, 9.30  PM.  The bedroom is described as cool, quiet, and dark. The patient reports that it takes 20 minutes to fall asleep, then continues to sleep for 8-10 hours, uninterrupted or woken up by one time , with the need to void (Nocturia).   Headaches have not been waking her up.   The preferred sleep position is laterally, with support of 2 pillows, (non- adjustable bed/ recliner ).  The total estimated sleep time is circa 8-10 hours.  Dreams are reportedly frequent/ and can be vivid.  Dream enactment has not been reported.   6.15 AM is the usual week- day rise time. The patient wakes up with multiple alarm set at 6. 6;10. And 6;15 AM .  She reports not feeling refreshed and restored in the morning, waking with symptoms such as dry mouth, morning headaches, stiffness or pain, and fatigue.   No sleep paralysis has been experienced.  Naps in daytime are taken infrequently (there is a desire to nap and opportunity), lasting from 1-2  hours and have no  refreshing quality. These do not interfere with nocturnal sleep.    Review  of Systems: Out of a complete 14 system review, the patient complains of only the following symptoms, and all other reviewed systems are negative.:  Hypersomnia; persistent , long sleep time    Depression/ anxiety: yes -  on Cymbalta and weaned off 3 weeks ago.  Chronic fatigue.   Pain:   Headaches in AM     Snoring, Sleep fragmentation, Nocturia   How likely are you to doze in the following situations: 0 = not likely, 1 = slight chance, 2 = moderate chance, 3 = high chance Sitting and Reading? Watching Television? Sitting inactive in a public place (theater or meeting)? As a passenger in a car for an hour without a break? Lying down in the afternoon when circumstances permit? Sitting  and talking to someone? Sitting quietly after lunch without alcohol? In a car, while stopped for a few minutes in traffic?   Total ESS =9 / 24 points.    FSS endorsed at 50 / 63 points.  GDS: NA Social History   Socioeconomic History   Marital status: Married    Spouse name: Not on file   Number of children: Not on file   Years of education: Not on file   Highest education level: Not on file  Occupational History   Not on file  Tobacco Use   Smoking status: Never   Smokeless tobacco: Never  Substance and Sexual Activity   Alcohol use: Yes    Comment: wine once a week with dinner   Drug use: Not on file   Sexual activity: Not on file  Other Topics Concern   Not on file  Social History Narrative   Not on file   Social Drivers of Health   Financial Resource Strain: Not on file  Food Insecurity: Not on file  Transportation Needs: Not on file  Physical Activity: Not on file  Stress: Not on file  Social Connections: Not on file    Family History  Problem Relation Age of Onset   Skin cancer Maternal Grandmother        36s   Breast cancer Neg Hx     Past Medical History:  Diagnosis Date   Asthma, childhood, allergic     Diabetes mellitus TYPE 1 , onset age 80,  insulin  pump.(HCC)     Past Surgical History:  Procedure Laterality Date   APPENDECTOMY     AUGMENTATION MAMMAPLASTY     CARPAL TUNNEL RELEASE       Current Outpatient Medications on File Prior to Visit  Medication Sig Dispense Refill   iron polysaccharides (NIFEREX) 150 MG capsule Take 150 mg by mouth 2 (two) times daily.     WEGOVY 2.4 MG/0.75ML SOAJ SQ injection Inject 2.4 mg into the skin once a week.     DULoxetine (CYMBALTA) 60 MG capsule Take 60 mg by mouth daily. (Patient not taking: Reported on 11/25/2023)     HUMALOG 100 UNIT/ML injection Inject into the skin.     Vitamin D, Ergocalciferol, (DRISDOL) 1.25 MG (50000 UNIT) CAPS capsule Take 50,000 Units by mouth once a week.     No current  facility-administered medications on file prior to visit.       Physical exam:   General: The patient was alert and appears not in acute distress.  Mood and affect are appropriate .  The patient's interactions are: Cooperative, makes eye contact, follows the instructions and answers questions coherently.  The patient is groomed and appropriately groomed and dressed. Head: Normocephalic, atraumatic.  Neck is supple. Mallampati: 1.  The neck circumference measured 15.5  inches. Nasal airflow was congested-  Overbite / Retrognathia was noted.  Dental status: biological  Cardiovascular:  Regular rate and cardiac rhythm by palpable pulse. No murmur, no bruits.  Respiratory: no audible wheezing, no tachypnoea.   Skin:  Without evidence of ankle edema. No discoloration.  Trunk:  BMI is 35  Highest weight was 220, now 195 lbs.  The patient's posture was erect.   Neurologic exam : The patient was awake and alert, oriented to place and time.   Attention span & concentration ability appeared normal.  Speech was fluent, without dysarthria, dysphonia or aphasia, and of normal volume.     Cranial nerves:  There was no loss of smell or taste reported  Pupils are round, equal in size and very briskly reactive to light and accomodation.  Funduscopic exam was deferred.  Extraocular movements in vertical and horizontal planes were intact and without nystagmus.  (No Diplopia reported). Visual fields by finger perimetry are intact. Hearing was intact to soft voice.    Facial sensation intact to fine touch.  Facial motor strength: Symmetric movement and tongue and uvula move midline.  Neck ROM: rotation, tilt and flexion extension were intact for age and shoulder shrug was symmetrical.    Motor exam:  Symmetric bulk, strength and ROM.   Normal tone without cog- wheeling, and symmetric grip strength.   Sensory:  Fine touch and vibration were tested by tuning fork and intact.  Proprioception  tested in the upper extremities was normal.   Coordination: The patient reported no problems with button closure and no changes to penmanship.   The Finger-to-nose maneuver was intact without evidence of ataxia, dysmetria or tremor.   Gait and station: Patient could rise unassisted from a seated position, without bracing, and walked without assistive device.  Stance was of normal/ wide width.  Deep tendon reflexes: Upper extremities did show symmetric DTRs. Lower extremity DTRs were symmetric /  attenuated.   Babinski response was deferred       I would like to thank Nichole Senior, MD and Gail Epp, MD ,  for allowing me to meet with Nurse Gail Jones , who is presenting with: snoring, daytime fatigue but no excessive daytime sleepiness - and reports morning headaches and frequent vivid dreams.  Fatigue may also be explained by the presence of depression, of anemia and is not relieved by naps or long hours of nocturnal sleep.  Her DM type one is excellent controlled and HBa1c was 5.6 !!! Risk factors for OSA were present,  including : ,weight of 199 lbs, and hight of 5.1 , a  neck size 15.5   and upper airway anatomy. Morning headaches can be a sign of hypoxia.     My Plan is to proceed with:  HST/ PSG  1) My goal is to screen for OSA based on the risk factors and this can be done by HST or in lab study.  Screening for sleep apnea in DM type 1 with morning headaches ( risk factor for morning headaches is hypoxia ) .  Hypersomnia with long sleep time.   I plan to follow up ( prn)  on positive HST and /or PSG results either personally or through our NP within 4-6 months.    A total time of 45  minutes consistent of a part of face to face encounter , exam and interview,  and additional preparation time for chart review  was spent .  At today's visit, we discussed treatment options, associated risk and benefits, and engage in counseling as needed including, but not limited to:   Sleep hygiene, Quality Sleep Habits, and Safety concerns for patients with daytime sleepiness who are warned to not operate machinery/ motor vehicles when drowsy. Additionally, the following were reviewed: Past medical records, past medical and surgical history, family and social background, as well as relevant laboratory results, imaging findings, and medical notes, where applicable.  This note was generated by myself in part by using dictation software, and as a result, it may contain unintentional typos and errors.  Nevertheless, effort was made to accurately convey the pertinent aspects of the patient's visit.   Dedra Gores, MD [facility] Sleep Board certified in Sleep Medicine by The [employer] of Sleep Medicine and Diplomate of the [employer] of [employer] (AASM) . Board certified In Neurology, Diplomat of the ABPN,  Fellow of the [employer].

## 2023-12-01 DIAGNOSIS — M2241 Chondromalacia patellae, right knee: Secondary | ICD-10-CM | POA: Insufficient documentation

## 2023-12-01 DIAGNOSIS — M2242 Chondromalacia patellae, left knee: Secondary | ICD-10-CM | POA: Diagnosis not present

## 2023-12-02 DIAGNOSIS — I10 Essential (primary) hypertension: Secondary | ICD-10-CM | POA: Diagnosis not present

## 2023-12-02 DIAGNOSIS — E109 Type 1 diabetes mellitus without complications: Secondary | ICD-10-CM | POA: Diagnosis not present

## 2023-12-10 ENCOUNTER — Ambulatory Visit: Admitting: Neurology

## 2023-12-10 ENCOUNTER — Telehealth: Payer: Self-pay | Admitting: Neurology

## 2023-12-10 NOTE — Telephone Encounter (Signed)
 [place]PS[place] BCBS fed pending

## 2023-12-11 ENCOUNTER — Telehealth: Payer: Self-pay | Admitting: Neurology

## 2023-12-11 NOTE — Telephone Encounter (Signed)
 Request to change New Pt appointment provider, call sent to New Pt referrals

## 2023-12-12 ENCOUNTER — Encounter: Payer: Self-pay | Admitting: Neurology

## 2023-12-15 ENCOUNTER — Ambulatory Visit: Admitting: Neurology

## 2023-12-15 NOTE — Telephone Encounter (Signed)
 Sent mychart message to patient from phone note on 12/10/23.

## 2023-12-15 NOTE — Telephone Encounter (Signed)
 [place] BCBS fed shara: [id number] (exp. 12/12/23 to 01/10/24)   Gail Jones

## 2023-12-18 ENCOUNTER — Ambulatory Visit: Admitting: Neurology

## 2023-12-23 DIAGNOSIS — M2242 Chondromalacia patellae, left knee: Secondary | ICD-10-CM | POA: Diagnosis not present

## 2023-12-23 DIAGNOSIS — M2241 Chondromalacia patellae, right knee: Secondary | ICD-10-CM | POA: Diagnosis not present

## 2023-12-24 ENCOUNTER — Ambulatory Visit: Admitting: Neurology

## 2023-12-24 DIAGNOSIS — E109 Type 1 diabetes mellitus without complications: Secondary | ICD-10-CM

## 2023-12-24 DIAGNOSIS — R519 Headache, unspecified: Secondary | ICD-10-CM

## 2023-12-24 DIAGNOSIS — D508 Other iron deficiency anemias: Secondary | ICD-10-CM

## 2023-12-24 DIAGNOSIS — G4733 Obstructive sleep apnea (adult) (pediatric): Secondary | ICD-10-CM | POA: Diagnosis not present

## 2023-12-24 DIAGNOSIS — G4726 Circadian rhythm sleep disorder, shift work type: Secondary | ICD-10-CM

## 2023-12-24 DIAGNOSIS — R5383 Other fatigue: Secondary | ICD-10-CM

## 2023-12-24 DIAGNOSIS — D509 Iron deficiency anemia, unspecified: Secondary | ICD-10-CM

## 2023-12-24 DIAGNOSIS — F32A Depression, unspecified: Secondary | ICD-10-CM

## 2023-12-25 NOTE — Progress Notes (Signed)
 [facility]  Gail Jones 43 year old female [birth year]   HOME SLEEP TEST REPORT ( by Watch PAT)   STUDY DATE: 12-24-2023 completed.     ORDERING CLINICIAN:  REFERRING CLINICIAN: Dr Nichole, MD ,    CLINICAL INFORMATION/HISTORY:  I have the pleasure of meeting with Gail Jones , on 11/25/23 , who is a 43 y.o.  female patient,  seen upon a referral by Dr Nichole   for a  Sleep Medicine Consultation.  The patient's referral information asked for a work for OSA, based on snoring and headaches. Has ankle edema.    Chief concern according to patient: Some Headache are in AM,  upon waking up,  dry mouth, and nasal congestion is chronic. She  has a past medical history of Asthma in childhood and Diabetes mellitus Type 1, insulin  pump, without complication (HCC).     Sleep relevant medical/ surgical and symptom history: The patient reports onset of snoring and mouth breathing symptoms over a time period of years. ENT  problems: (Sinusitis, allergic rhinitis),  Obesity, lost 25 pounds on wgovy,  Autoimmune disorders; DM 2,  Anemia, iron deficiency. Depression.      Family medical history: There are  biological family members affected by Sleep apnea ( father on CPAP ), excessive daytime sleepiness (father ).     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  The patient currently works days only but used to work in shifts (chief technology officer,) until 2023.     Epworth sleepiness score: 9 / 24 points.    FSS endorsed at 50 / 63 points.  GDS: NA  BMI: 35 kg/m  Neck Circumference: 15.5       Sleep Summary:   Total Recording Time (hours, min):   7 h 13 minutes      Total Sleep Time (hours, min):    6 h 6 min             Percent REM (%):  18.4%                                       Respiratory Indices:   Calculated pAHI (per  AASM guideline): 16.4/h                         REM pAHI:   13/h                                               NREM pAHI:     17/h                          Positional AHI:   18.7 /h in supine, highest AHI.    Snoring:   43 dB  mean volume and snoring was present for more than 50% of recording.  Oxygen Saturation Statistics:   Oxygen Saturation (%) Mean:   94%       O2 Saturation Range (%):   84- 98 %                                   O2 Saturation (minutes) <89%:     < 1 minute       Pulse Rate Statistics:   Pulse Mean (bpm):   92 bpm               Pulse Range:    75 -116 bpm             IMPRESSION:  This HST confirms the presence of  moderate obstructive sleep apnea , an AHI < 15/h is considered mild.  There were no oxygen desaturations of clinical significance, and no REM sleep dependence to this apnea,  There some positional component : accentuated  AHI in supine sleep.    RECOMMENDATION: while the most effective treatment for OSA is CPAP ( auto PAP ) , this patient can in addition help to reduce her apnea by avoiding supine sleep and by weight loss.  Snoring can improve with the use of a dental device, which may in addition reduce the AHI to the single digits as well.   This degree of obstructive sleep apnea ( moderate - severe OSA) is FDA approved for the use of  Zepbound, also this will be discussed with dr Nichole, her endocrinologist ).  My plan A would be therapy by ResMed autotitration CPAP device, used at  5-15 cm water pressure, 2 cm EPR , and heated humidification, mask of comfort.    Any patient should be cautioned not to drive, work at heights, or operate dangerous or heavy equipment when tired or sleepy.   Review of good sleep hygiene measures is accessible to any sleep clinic patient and can be reiterated through online material- I we recommend the Guide to better Sleep   by the NIH.   Weight loss and Core Strength  improvement is highly recommended for individuals with low muscle tone and/ or a BMI over 30.  Any CPAP patient should be reminded to be fully compliant with PAP therapy , (defined as using PAP therapy for more than 4 hours each night ) with the goal to improve sleep related symptoms and decrease long term cardiovascular risks. Any PAP therapy patient should be reminded, that it may take up to 3 months to get fully used to using PAP and it may take 1-2 weeks for an established CPAP user to acclimatize to changes in pressure or mask. The earlier full compliance is achieved, the better long term compliance tends to be.   Please note that untreated obstructive sleep apnea may carry additional perioperative morbidity. Patients with significant obstructive sleep apnea should receive perioperative PAP therapy and the surgical team should be informed of the diagnosis and degree of sleep disordered breathing.  Sleep fragmentation in the presence of normal proportional sleep stages is a nonspecific findings and per se does not signify an intrinsic sleep disorder or a cause for the patient's sleep-related symptoms.  Causes include (but are not limited to) the unfamiliarity of sleeping while recorded by HST device or sleeping in a sleep lab for a full Polysomnography sleep study, but also circadian rhythm disturbances, medication side effects or an underlying mood disorder or medical problem.   The referring physician  will be notified of the test results.       INTERPRETING PHYSICIAN:   Dedra Gores, MD  [facility] Sleep Board certified by The [employer] of Sleep Medicine and Diplomate of the [employer] of Sleep Medicine. Board certified In Neurology through the ABPN, Fellow of the [employer] of Neu[employer].

## 2023-12-30 DIAGNOSIS — Z01419 Encounter for gynecological examination (general) (routine) without abnormal findings: Secondary | ICD-10-CM | POA: Diagnosis not present

## 2023-12-31 ENCOUNTER — Encounter: Payer: Self-pay | Admitting: Neurology

## 2023-12-31 ENCOUNTER — Ambulatory Visit: Payer: Self-pay | Admitting: Neurology

## 2023-12-31 ENCOUNTER — Ambulatory Visit: Admitting: Neurology

## 2023-12-31 ENCOUNTER — Telehealth: Payer: Self-pay | Admitting: Pharmacist

## 2023-12-31 VITALS — BP 126/80 | HR 85 | Ht 61.0 in | Wt 199.0 lb

## 2023-12-31 DIAGNOSIS — G4733 Obstructive sleep apnea (adult) (pediatric): Secondary | ICD-10-CM | POA: Diagnosis not present

## 2023-12-31 DIAGNOSIS — R519 Headache, unspecified: Secondary | ICD-10-CM | POA: Diagnosis not present

## 2023-12-31 DIAGNOSIS — G4726 Circadian rhythm sleep disorder, shift work type: Secondary | ICD-10-CM

## 2023-12-31 DIAGNOSIS — G4719 Other hypersomnia: Secondary | ICD-10-CM | POA: Diagnosis not present

## 2023-12-31 MED ORDER — ARMODAFINIL 200 MG PO TABS: 200.0000 mg | ORAL_TABLET | Freq: Every day | ORAL | 5 refills | Status: AC | PRN

## 2023-12-31 MED ORDER — MOMETASONE FUROATE 50 MCG/ACT NA SUSP: 2.0000 | Freq: Every day | NASAL | 12 refills | Status: AC

## 2023-12-31 NOTE — Telephone Encounter (Signed)
 Pharmacy Patient Advocate Encounter  Received notification from [facility] that Prior Authorization for Armodafinil  200MG  tablets has been APPROVED from 12/01/2023 to 12/30/2024   PA #/Case ID/Reference #: [id number]

## 2023-12-31 NOTE — Procedures (Signed)
 [facility]  Gail Jones 43 year old female [birth year]   HOME SLEEP TEST REPORT ( by Watch PAT)   STUDY DATE: 12-24-2023 completed.     ORDERING CLINICIAN:  REFERRING CLINICIAN: Dr Nichole, MD ,    CLINICAL INFORMATION/HISTORY:  I have the pleasure of meeting with Gail Jones , on 11/25/23 , who is a 43 y.o.  female patient,  seen upon a referral by Dr Nichole   for a  Sleep Medicine Consultation.  The patient's referral information asked for a work for OSA, based on snoring and headaches. Has ankle edema.    Chief concern according to patient: Some Headache are in AM,  upon waking up,  dry mouth, and nasal congestion is chronic. She  has a past medical history of Asthma in childhood and Diabetes mellitus Type 1, insulin  pump, without complication (HCC).     Sleep relevant medical/ surgical and symptom history: The patient reports onset of snoring and mouth breathing symptoms over a time period of years. ENT  problems: (Sinusitis, allergic rhinitis),  Obesity, lost 25 pounds on wgovy,  Autoimmune disorders; DM 2,  Anemia, iron deficiency. Depression.      Family medical history: There are  biological family members affected by Sleep apnea ( father on CPAP ), excessive daytime sleepiness (father ).     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  The patient currently works days only but used to work in shifts (chief technology officer,) until 2023.     Epworth sleepiness score: 9 / 24 points.    FSS endorsed at 50 / 63 points.  GDS: NA  BMI: 35 kg/m  Neck Circumference: 15.5       Sleep Summary:   Total Recording Time (hours, min):   7 h 13 minutes      Total Sleep Time (hours, min):    6 h 6 min             Percent REM (%):  18.4%                                       Respiratory Indices:   Calculated pAHI (per  AASM guideline): 16.4/h                         REM pAHI:   13/h                                               NREM pAHI:     17/h                          Positional AHI:   18.7 /h in supine, highest AHI.    Snoring:   43 dB  mean volume and snoring was present for more than 50% of recording.  Oxygen Saturation Statistics:   Oxygen Saturation (%) Mean:   94%       O2 Saturation Range (%):   84- 98 %                                   O2 Saturation (minutes) <89%:     < 1 minute       Pulse Rate Statistics:   Pulse Mean (bpm):   92 bpm               Pulse Range:    75 -116 bpm             IMPRESSION:  This HST confirms the presence of  moderate obstructive sleep apnea , an AHI < 15/h is considered mild.  There were no oxygen desaturations of clinical significance, and no REM sleep dependence to this apnea,  There some positional component : accentuated  AHI in supine sleep.    RECOMMENDATION: while the most effective treatment for OSA is CPAP ( auto PAP ) , this patient can in addition help to reduce her apnea by avoiding supine sleep and by weight loss.  Snoring can improve with the use of a dental device, which may in addition reduce the AHI to the single digits as well.   This degree of obstructive sleep apnea ( moderate - severe OSA) is FDA approved for the use of  Zepbound, also this will be discussed with dr Nichole, her endocrinologist ).  My plan A would be therapy by ResMed autotitration CPAP device, used at  5-15 cm water pressure, 2 cm EPR , and heated humidification, mask of comfort.    Any patient should be cautioned not to drive, work at heights, or operate dangerous or heavy equipment when tired or sleepy.   Review of good sleep hygiene measures is accessible to any sleep clinic patient and can be reiterated through online material- I we recommend the Guide to better Sleep   by the NIH.   Weight loss and Core Strength  improvement is highly recommended for individuals with low muscle tone and/ or a BMI over 30.  Any CPAP patient should be reminded to be fully compliant with PAP therapy , (defined as using PAP therapy for more than 4 hours each night ) with the goal to improve sleep related symptoms and decrease long term cardiovascular risks. Any PAP therapy patient should be reminded, that it may take up to 3 months to get fully used to using PAP and it may take 1-2 weeks for an established CPAP user to acclimatize to changes in pressure or mask. The earlier full compliance is achieved, the better long term compliance tends to be.   Please note that untreated obstructive sleep apnea may carry additional perioperative morbidity. Patients with significant obstructive sleep apnea should receive perioperative PAP therapy and the surgical team should be informed of the diagnosis and degree of sleep disordered breathing.  Sleep fragmentation in the presence of normal proportional sleep stages is a nonspecific findings and per se does not signify an intrinsic sleep disorder or a cause for the patient's sleep-related symptoms.  Causes include (but are not limited to) the unfamiliarity of sleeping while recorded by HST device or sleeping in a sleep lab for a full Polysomnography sleep study, but also circadian rhythm disturbances, medication side effects or an underlying mood disorder or medical problem.   The referring physician  will be notified of the test results.       INTERPRETING PHYSICIAN:   Dedra Gores, MD  [facility] Sleep Board certified by The [employer] of Sleep Medicine and Diplomate of the [employer] of Sleep Medicine. Board certified In Neurology through the ABPN, Fellow of the [employer].

## 2023-12-31 NOTE — Progress Notes (Signed)
 Provider:  Dedra Gores, MD  Primary Care Physician:  Nichole Senior, MD [address] [place] [address]     Referring Provider: Nichole Senior, Md [address] [place],  [place] [address]          Chief Complaint according to patient   Patient presents with:                HISTORY OF PRESENT ILLNESS:  Gail Jones is a 43 y.o. female patient who is here for revisit 12/31/2023 for  headaches and sleep apnea test follow up.    12-24-2023: HST confirms the presence of  moderate obstructive sleep apnea , an AHI < 15/h is considered mild.  There were no oxygen desaturations of clinical significance, and no REM sleep dependence to this apnea,  There some positional component : accentuated  AHI in supine sleep.    RECOMMENDATION: while the most effective treatment for OSA is CPAP ( auto PAP ) , this patient can in addition help to reduce her apnea by avoiding supine sleep and by weight loss.  This patient has sinus problems and may have more HA related  to sinus than to hypoxia.   Snoring can improve with the use of a dental device, which may in addition reduce the AHI to the single digits as well.    This degree of obstructive sleep apnea ( moderate - severe OSA) is FDA approved for the use of  Zepbound, also this will be discussed with dr Nichole, her endocrinologist ).   My plan A would be therapy by ResMed autotitration CPAP device, used at  5-15 cm water pressure, 2 cm EPR , and heated humidification, mask of comfort.      HA are mostly right temple focussed, CT head was reviewed, negative for sinus congestion.        Review of Systems: Out of a complete 14 system review, the patient complains of only the following symptoms, and all other reviewed systems are negative.:   SLEEPINESS ?  How likely are you to doze in the following situations: 0 = not likely, 1 = slight chance, 2 = moderate chance, 3 = high chance  Sitting and Reading? Watching  Television? Sitting inactive in a public place (theater or meeting)? Lying down in the afternoon when circumstances permit? Sitting and talking to someone? Sitting quietly after lunch without alcohol? In a car, while stopped for a few minutes in traffic? As a passenger in a car for an hour without a break?  Total =  15 / 24  FSS at 51 / 63/        Social History   Socioeconomic History   Marital status: Married    Spouse name: Not on file   Number of children: Not on file   Years of education: Not on file   Highest education level: Not on file  Occupational History   Not on file  Tobacco Use   Smoking status: Never   Smokeless tobacco: Never  Substance and Sexual Activity   Alcohol use: Yes    Comment: wine once a week with dinner   Drug use: Not on file   Sexual activity: Not on file  Other Topics Concern   Not on file  Social History Narrative   2 cups of caffeine daily    Social Drivers of Health   Financial Resource Strain: Not on file  Food Insecurity: Not on file  Transportation Needs: Not on file  Physical Activity: Not on file  Stress: Not on file  Social Connections: Not on file    Family History  Problem Relation Age of Onset   Migraines Mother    Stroke Father    Sleep apnea Father    Skin cancer Maternal Grandmother        66s   Sleep apnea Maternal Aunt    Sleep apnea Maternal Uncle    Breast cancer Neg Hx    Seizures Neg Hx     Past Medical History:  Diagnosis Date   Asthma    Diabetes mellitus without complication (HCC)     Past Surgical History:  Procedure Laterality Date   APPENDECTOMY     AUGMENTATION MAMMAPLASTY     CARPAL TUNNEL RELEASE       Current Outpatient Medications on File Prior to Visit  Medication Sig Dispense Refill   HUMALOG 100 UNIT/ML injection Inject into the skin.     iron polysaccharides (NIFEREX) 150 MG capsule Take 150 mg by mouth 2 (two) times daily.     loratadine (CLARITIN) 10 MG tablet Take 10  mg by mouth daily.     UNABLE TO FIND Med Name: hydrochlorothiazide  25mg      VITAMIN D PO Take by mouth once a week.     Vitamin D, Ergocalciferol, (DRISDOL) 1.25 MG (50000 UNIT) CAPS capsule Take 50,000 Units by mouth once a week.     WEGOVY 2.4 MG/0.75ML SOAJ SQ injection Inject 2.4 mg into the skin once a week.     No current facility-administered medications on file prior to visit.    Allergies  Allergen Reactions   Other Hives, Itching and Other (See Comments)     DIAGNOSTIC DATA (LABS, IMAGING, TESTING) - I reviewed patient records, labs, notes, testing and imaging myself where available.  No results found for: WBC, HGB, HCT, MCV, PLT No results found for: NA, K, CL, CO2, GLUCOSE, BUN, CREATININE, CALCIUM, PROT, ALBUMIN, AST, ALT, ALKPHOS, BILITOT, GFRNONAA, GFRAA No results found for: CHOL, HDL, LDLCALC, LDLDIRECT, TRIG, CHOLHDL No results found for: YHAJ8R No results found for: VITAMINB12 No results found for: TSH  PHYSICAL EXAM:  Vitals:   12/31/23 0859  BP: 126/80  Pulse: 85  SpO2: 98%   No data found. Body mass index is 37.6 kg/m.   Wt Readings from Last 3 Encounters:  12/31/23 199 lb (90.3 kg)  11/25/23 199 lb (90.3 kg)  03/16/12 168 lb 9.6 oz (76.5 kg)     Ht Readings from Last 3 Encounters:  12/31/23 5' 1 (1.549 m)  11/25/23 5' 1 (1.549 m)  03/16/12 5' 1 (1.549 m)      General: The patient is awake, alert and appears not in acute distress and groomed. Head: Normocephalic, atraumatic.  Neck is supple. The patient was alert and appears not in acute distress.  Mood and affect are appropriate .  The patient's interactions are: Cooperative, makes eye contact, follows the instructions and answers questions coherently.  The patient is groomed and appropriately groomed and dressed. Head: Normocephalic, atraumatic.  Neck is supple. Mallampati: 1.  The neck circumference measured 15.5   inches. Nasal airflow was congested-  Overbite / Retrognathia was noted.  Dental status: biological  Cardiovascular:  Regular rate and cardiac rhythm by palpable pulse. No murmur, no bruits.  Respiratory: no audible wheezing, no tachypnoea.   Skin:  Without evidence of ankle edema. No discoloration.  Trunk:  BMI is 35  Highest weight was 220, now 195 lbs.  The patient's posture was erect.   Neurologic exam : The patient was awake and alert, oriented to place and time.   Attention span & concentration ability appeared normal.  Speech was fluent, without dysarthria, dysphonia or aphasia, and of normal volume.      Cranial nerves:  There was no loss of smell or taste reported  Pupils are round, equal in size and very briskly reactive to light and accomodation.  Funduscopic exam was deferred.  Extraocular movements in vertical and horizontal planes were intact and without nystagmus.  (No Diplopia reported). Visual fields by finger perimetry are intact. Hearing was intact to soft voice.    Facial sensation intact to fine touch.  Facial motor strength: Symmetric movement and tongue and uvula move midline.  Neck ROM: rotation, tilt and flexion extension were intact for age and shoulder shrug was symmetrical.    Motor exam:  Symmetric bulk, strength and ROM.   Normal tone without cog- wheeling, and symmetric grip strength.Gait and Station: Normal. Romberg's sign is absent.   ASSESSMENT AND PLAN :   43 y.o. year old female  here with:    1) high degree of daytime sleepiness, sinus congestion, rhinitis.    2) moderate degree of OSA : can be qualifying factor for Zepbound.  Qualifies for modafinil/ Armodafinil   if needed.  30 # ordered of 200 mg tab for Prn use in case of excessive daytime sleepiness.    3)  start CPAP now and see me or NO in 3-4 months for compliance and new Epworth score.     I would like to thank Nichole Senior, MD and Nichole Senior, Md [address] [place],  [place] [address] for allowing me to meet with this pleasant patient.   Sleep Clinic Patients are generally offered input on sleep hygiene, life style changes and how to improve compliance with medical treatment where applicable. Review and reiteration of good sleep hygiene measures is offered to any sleep clinic patient, be it in the first consultation or with any follow up visits.    Any patient with sleepiness should be cautioned not to drive, work at heights, or operate dangerous or heavy equipment when feeling tired or sleepy.      The patient will be seen in follow-up in the sleep clinic at [facility] for discussion of test results, sleep related symptoms and treatment compliance review, further management strategies, etc.   The referring provider will be notified of the test results.   The patient's condition requires frequent monitoring and adjustments in the treatment plan, reflecting the ongoing complexity of care.  This provider is the continuing focal point for all needed services for this condition.  After spending a total time of  23  minutes face to face and time for  history taking, physical and neurologic examination, review of laboratory studies,  personal review of imaging studies, reports and results of other testing and review of referral information / records as far as provided in visit,   Electronically signed by: Dedra Gores, MD 12/31/2023 9:34 AM  [facility] Board certified by The [employer] of Sleep Medicine and Diplomate of the [employer] Sleep Medicine. Board certified In Neurology through the ABPN, Fellow of the [employer] Neu[employer].

## 2024-01-02 DIAGNOSIS — R051 Acute cough: Secondary | ICD-10-CM | POA: Diagnosis not present

## 2024-01-02 DIAGNOSIS — R0981 Nasal congestion: Secondary | ICD-10-CM | POA: Diagnosis not present

## 2024-01-02 DIAGNOSIS — J019 Acute sinusitis, unspecified: Secondary | ICD-10-CM | POA: Diagnosis not present

## 2024-01-09 NOTE — Telephone Encounter (Signed)
 Cpap order placed to [facility]:

## 2024-01-12 DIAGNOSIS — E1165 Type 2 diabetes mellitus with hyperglycemia: Secondary | ICD-10-CM | POA: Diagnosis not present

## 2024-01-15 ENCOUNTER — Encounter

## 2024-03-30 ENCOUNTER — Encounter: Admitting: Adult Health
